# Patient Record
Sex: Female | Born: 1980 | Race: Black or African American | Hispanic: No | Marital: Single | Smoking: Current every day smoker
Health system: Southern US, Community
[De-identification: ages and names within clinical notes are randomized; demographics above are authoritative.]

## PROBLEM LIST (undated history)

## (undated) DIAGNOSIS — E785 Hyperlipidemia, unspecified: Secondary | ICD-10-CM

## (undated) HISTORY — DX: Hyperlipidemia, unspecified: E78.5

---

## 2018-06-11 ENCOUNTER — Encounter (HOSPITAL_COMMUNITY): Payer: Self-pay | Admitting: Emergency Medicine

## 2018-06-11 ENCOUNTER — Emergency Department (HOSPITAL_COMMUNITY)
Admission: EM | Admit: 2018-06-11 | Discharge: 2018-06-11 | Disposition: A | Discharge status: Home / Self Care | Payer: Self-pay | Attending: Emergency Medicine | Admitting: Emergency Medicine

## 2018-06-11 ENCOUNTER — Other Ambulatory Visit: Payer: Self-pay

## 2018-06-11 DIAGNOSIS — G8929 Other chronic pain: Secondary | ICD-10-CM | POA: Insufficient documentation

## 2018-06-11 DIAGNOSIS — M25562 Pain in left knee: Secondary | ICD-10-CM | POA: Insufficient documentation

## 2018-06-11 DIAGNOSIS — F17210 Nicotine dependence, cigarettes, uncomplicated: Secondary | ICD-10-CM | POA: Insufficient documentation

## 2018-06-11 NOTE — ED Triage Notes (Signed)
Pt c/o left knee pain x year. States does l;ot of walking and standing at work. Denies any new falls or injuries.

## 2018-06-11 NOTE — Discharge Instructions (Addendum)
You have been diagnosed today with left knee pain.  At this time there does not appear to be the presence of an emergent medical condition, however there is always the potential for conditions to change. Please read and follow the below instructions.  Please return to the Emergency Department immediately for any new or worsening symptoms or if your symptoms. Please be sure to follow up with your Primary Care Provider within one week regarding your visit today; please call their office to schedule an appointment even if you are feeling better for a follow-up visit. You have refused x-ray today.  You may have ligament or tendon injury of the left knee.  Fractures are also possible.  Please use the knee sleeve given to you today for support of your left knee.  Please use rest, ice and elevation to help with your symptoms.  Please avoid activities that exacerbate your knee pain. Please call the orthopedic specialist Dr. Nona Dell office on Monday to schedule a follow-up appointment regarding your left knee pain.  Get help right away if: Your knee swells, and the swelling gets worse. You cannot move your knee. You have very bad knee pain. You have fever You have any new or concerning symptoms.  Please read the additional information packets attached to your discharge summary.  Do not take your medicine if  develop an itchy rash, swelling in your mouth or lips, or difficulty breathing.

## 2018-06-11 NOTE — ED Provider Notes (Signed)
[facility] DEPT Provider Note   CSN: [id number] Arrival date & time: 06/11/18  1506    History   Chief Complaint Chief Complaint  Patient presents with  . Knee Pain    HPI Tammy Vargas is a 38 y.o. female presented today for left knee pain.  Patient reports that she has had pain to the left knee for greater than 1 year.  She states that she was previously evaluated and told that she had ligament injury of an unknown ligament in her knee.  She never followed up with orthopedic specialist regarding her pain.  Patient reports that she has had intermittent pain of the left knee for the past year.  Patient states that over the past 2 weeks she has been on her feet a lot more often at work and feels that this is exacerbated her pain.  Patient describes a moderate intensity aching pain to the medial aspect of her right knee that is only present after working on her feet for long periods of time and is improved with rest and elevation.  Patient denies injury or trauma to her knee.  She denies fall.  She denies fever or swelling.  Patient reports that her pain is not severe today because she was off of work and was able to elevate it for most of the day.  Patient reports that she does not want x-ray today and she only wants referral to orthopedist and work note.    HPI  History reviewed. No pertinent past medical history.  There are no active problems to display for this patient.   History reviewed. No pertinent surgical history.   OB History   No obstetric history on file.      Home Medications    Prior to Admission medications   Not on File    Family History No family history on file.  Social History Social History   Tobacco Use  . Smoking status: Current Every Day Smoker    Types: Cigarettes  . Smokeless tobacco: Never Used  Substance Use Topics  . Alcohol use: Not on file  . Drug use: Not on file     Allergies   Patient has no  known allergies.   Review of Systems Review of Systems  Constitutional: Negative.  Negative for chills and fever.  Musculoskeletal: Positive for arthralgias (Left knee). Negative for back pain, joint swelling and neck pain.  Skin: Negative.  Negative for color change and wound.  Neurological: Negative.  Negative for dizziness, weakness, numbness and headaches.   Physical Exam Updated Vital Signs BP (!) 135/93 (BP Location: Right Arm)   Pulse 84   Temp 98.5 F (36.9 C) (Oral)   Resp 18   Ht 5\' 2"  (1.575 m)   Wt 68 kg   LMP 05/17/2018   SpO2 99%   BMI 27.44 kg/m   Physical Exam Constitutional:      General: She is not in acute distress.    Appearance: Normal appearance. She is well-developed. She is not ill-appearing or diaphoretic.  HENT:     Head: Normocephalic and atraumatic.     Right Ear: External ear normal.     Left Ear: External ear normal.     Nose: Nose normal.  Eyes:     General: Vision grossly intact. Gaze aligned appropriately.     Pupils: Pupils are equal, round, and reactive to light.  Neck:     Musculoskeletal: Normal range of motion.     Trachea:  Trachea and phonation normal. No tracheal deviation.  Cardiovascular:     Pulses:          Dorsalis pedis pulses are 2+ on the right side and 2+ on the left side.       Posterior tibial pulses are 2+ on the right side and 2+ on the left side.  Pulmonary:     Effort: Pulmonary effort is normal. No respiratory distress.  Musculoskeletal: Normal range of motion.     Right knee: Normal.     Left knee: She exhibits normal range of motion, no swelling, no effusion, no ecchymosis and no deformity. Tenderness found. Medial joint line tenderness noted. No lateral joint line and no patellar tendon tenderness noted.     Right ankle: Normal.     Left ankle: Normal.     Right lower leg: Normal.     Left lower leg: Normal.     Right foot: Normal.     Left foot: Normal.     Comments: Left Knee:   Appearance normal. No  obvious deformity. No skin swelling, erythema, heat, fluctuance or break of the skin.  Tenderness to palpation over medial joint line.  Active and passive flexion and extension intact without crepitus but with mild pain.  Negative anterior/poster drawer bilaterally. Negative McMurray's test. Negative ballottement test. No varus or valgus laxity or locking. No tenderness to palpation of hips or ankles.Compartments soft.  Neurovascular intact to bilateral lower extremities.  Patient is ambulatory around room with slight limping gait secondary to left knee pain.   Feet:     Right foot:     Protective Sensation: 5 sites tested. 5 sites sensed.     Left foot:     Protective Sensation: 5 sites tested. 5 sites sensed.  Skin:    General: Skin is warm and dry.  Neurological:     Mental Status: She is alert.     GCS: GCS eye subscore is 4. GCS verbal subscore is 5. GCS motor subscore is 6.     Comments: Speech is clear and goal oriented, follows commands Major Cranial nerves without deficit, no facial droop Moves extremities without ataxia, coordination intact  Psychiatric:        Behavior: Behavior normal.    ED Treatments / Results  Labs (all labs ordered are listed, but only abnormal results are displayed) Labs Reviewed - No data to display  EKG None  Radiology No results found.  Procedures Procedures (including critical care time)  Medications Ordered in ED Medications - No data to display   Initial Impression / Assessment and Plan / ED Course  I have reviewed the triage vital signs and the nursing notes.  Pertinent labs & imaging results that were available during my care of the patient were reviewed by me and considered in my medical decision making (see chart for details).    38 year old female reports is otherwise healthy presenting today for acute on chronic left knee pain.  Patient denies injury or trauma to the knee and states that has been causing her intermittent  pain for the past 1 year.  Patient attributes her exacerbation to increasing ambulation at work over the past few days.  Patient has been using rice protocol with relief of her symptoms.  Patient is requesting work note today as well as orthopedic referral.  Physical examination reveals no acute abnormalities of the bilateral lower extremities.  She has some tenderness of the medial joint line of the left knee without deformity,  sign of injury or effusion.  No obvious laxity on examination today and patient is ambulatory without assistance. Bilateral lower extremities neurovascularly intact; no signs of infection, septic joint, DVT, compartment syndrome. Patient with full ROM and 5/5 strength with all movements.  I have offered the patient x-ray today of her left knee to evaluate for injury as she has not had previous imaging in our facility however she has refused.  Patient is requesting discharge with referral to an orthopedist and a work note.  I feel that this is reasonable at this time and the symptom provided to the patient.  Patient has been provided with a knee sleeve in emergency department today.  At this time there does not appear to be any evidence of an acute emergency medical condition and the patient appears stable for discharge with appropriate outpatient follow up. Diagnosis was discussed with patient who verbalizes understanding of care plan and is agreeable to discharge. I have discussed return precautions with patient and family at bedside who verbalize understanding of return precautions. Patient strongly encouraged to follow-up with their PCP and ortho. All questions answered.   Note: Portions of this report may have been transcribed using voice recognition software. Every effort was made to ensure accuracy; however, inadvertent computerized transcription errors may still be present. Final Clinical Impressions(s) / ED Diagnoses   Final diagnoses:  Chronic pain of left knee    ED  Discharge Orders    None       Gari Crown 06/11/18 1655    Lacretia Leigh, MD 06/12/18 2306

## 2018-06-23 ENCOUNTER — Encounter (HOSPITAL_COMMUNITY): Payer: Self-pay | Admitting: Emergency Medicine

## 2018-06-23 ENCOUNTER — Other Ambulatory Visit: Payer: Self-pay

## 2018-06-23 ENCOUNTER — Emergency Department (HOSPITAL_COMMUNITY)
Admission: EM | Admit: 2018-06-23 | Discharge: 2018-06-23 | Disposition: A | Discharge status: Home / Self Care | Payer: Medicaid Other | Attending: Emergency Medicine | Admitting: Emergency Medicine

## 2018-06-23 DIAGNOSIS — L739 Follicular disorder, unspecified: Secondary | ICD-10-CM | POA: Insufficient documentation

## 2018-06-23 DIAGNOSIS — F17210 Nicotine dependence, cigarettes, uncomplicated: Secondary | ICD-10-CM | POA: Insufficient documentation

## 2018-06-23 MED ORDER — DOXYCYCLINE HYCLATE 100 MG PO CAPS: 100.0000 mg | ORAL_CAPSULE | Freq: Two times a day (BID) | ORAL | 0 refills | Status: DC

## 2018-06-23 NOTE — Discharge Instructions (Addendum)
Doxycycline as prescribed.  Follow-up with dermatology if your symptoms or not improving in the next week, and return to the ER if symptoms significantly worsen or change.

## 2018-06-23 NOTE — ED Provider Notes (Signed)
[facility] EMERGENCY DEPARTMENT Provider Note   CSN: [id number] Arrival date & time: 06/23/18  1122    History   Chief Complaint Chief Complaint  Patient presents with  . Rash    HPI Tammy Vargas is a 38 y.o. female.     Patient is a 39 year old female with no significant past medical history.  She presents today with complaints of facial rash.  She reports using proactive for several months, but has never had an issue with it before.  The rash is somewhat itchy.  She denies any other new contacts or exposures.  The rash is located nowhere else on her body.  The history is provided by the patient.  Rash  Location:  Face Quality: itchiness   Severity:  Mild Timing:  Constant Progression:  Worsening Chronicity:  New Relieved by:  Nothing Worsened by:  Nothing Ineffective treatments:  None tried Associated symptoms: no diarrhea and no fatigue     History reviewed. No pertinent past medical history.  There are no active problems to display for this patient.   History reviewed. No pertinent surgical history.   OB History   No obstetric history on file.      Home Medications    Prior to Admission medications   Not on File    Family History No family history on file.  Social History Social History   Tobacco Use  . Smoking status: Current Every Day Smoker    Types: Cigarettes  . Smokeless tobacco: Never Used  Substance Use Topics  . Alcohol use: Not on file  . Drug use: Not on file     Allergies   Patient has no known allergies.   Review of Systems Review of Systems  Constitutional: Negative for fatigue.  Gastrointestinal: Negative for diarrhea.  Skin: Positive for rash.  All other systems reviewed and are negative.    Physical Exam Updated Vital Signs BP 140/84 (BP Location: Right Arm)   Pulse 81   Temp 99.2 F (37.3 C) (Oral)   Resp 14   SpO2 99%   Physical Exam Vitals signs and nursing note reviewed.   Constitutional:      Appearance: Normal appearance.  HENT:     Head: Normocephalic.  Pulmonary:     Effort: Pulmonary effort is normal.  Skin:    General: Skin is warm and dry.     Comments: Patient has a follicular rash to the face.  Neurological:     General: No focal deficit present.     Mental Status: She is alert.      ED Treatments / Results  Labs (all labs ordered are listed, but only abnormal results are displayed) Labs Reviewed - No data to display  EKG None  Radiology No results found.  Procedures Procedures (including critical care time)  Medications Ordered in ED Medications - No data to display   Initial Impression / Assessment and Plan / ED Course  I have reviewed the triage vital signs and the nursing notes.  Pertinent labs & imaging results that were available during my care of the patient were reviewed by me and considered in my medical decision making (see chart for details).  The rash is acting like in appearance.  Patient will be treated with doxycycline and follow-up with dermatology if not improving in the next week.  I will also have her stop using her proactive.  Final Clinical Impressions(s) / ED Diagnoses   Final diagnoses:  None  ED Discharge Orders    None       Veryl Speak, MD 06/23/18 1151

## 2018-06-23 NOTE — ED Triage Notes (Addendum)
Rash on face x 1 week , states itchy and it burns,  Started  Pro active  Face cream a month ago  Just on faCE and upper neck no where else denies any thing new detergent,  States has been using vaseline with the proactive, denies sob or difficulty breathing or swallowing

## 2019-02-13 ENCOUNTER — Other Ambulatory Visit: Payer: Self-pay

## 2019-02-13 DIAGNOSIS — Z20822 Contact with and (suspected) exposure to covid-19: Secondary | ICD-10-CM

## 2019-02-14 LAB — NOVEL CORONAVIRUS, NAA: SARS-CoV-2, NAA: NOT DETECTED

## 2019-10-03 ENCOUNTER — Other Ambulatory Visit: Payer: Self-pay

## 2019-10-03 ENCOUNTER — Encounter (HOSPITAL_COMMUNITY): Payer: Self-pay

## 2019-10-03 ENCOUNTER — Ambulatory Visit (HOSPITAL_COMMUNITY)
Admission: EM | Admit: 2019-10-03 | Discharge: 2019-10-03 | Disposition: A | Discharge status: Home / Self Care | Payer: Medicaid Other | Attending: Family Medicine | Admitting: Family Medicine

## 2019-10-03 DIAGNOSIS — S90852A Superficial foreign body, left foot, initial encounter: Secondary | ICD-10-CM

## 2019-10-03 NOTE — ED Triage Notes (Signed)
Reports she got a splinter in the heel of her left foot one week ago. States she thought she got everything out, but yesterday the area became swollen and painful.

## 2019-10-03 NOTE — Discharge Instructions (Addendum)
We opened up the bottom your foot got a lot of infection out.  Hopefully piece of the splinter came out with this. We also flushed the foot slightly. Keep soaking the foot, massage the area and if symptoms continue or worsen please follow-up

## 2019-10-03 NOTE — ED Provider Notes (Signed)
[facility]    CSN: [id number] Arrival date & time: 10/03/19  0865      History   Chief Complaint Chief Complaint  Patient presents with  . Foreign Body in Skin    HPI Tammy Vargas is a 39 y.o. female.   Patient is a 39 year old female presents today with foreign body in left heel.  Reporting she got a splinter in the left heel and attempted to remove it on her own.  Reporting she thought that she got most of the splinter out but since she has had increased pain and swelling of the left heel.  She is attempted to soak the foot and remove without any relief.     History reviewed. No pertinent past medical history.  There are no problems to display for this patient.   History reviewed. No pertinent surgical history.  OB History   No obstetric history on file.      Home Medications    Prior to Admission medications   Medication Sig Start Date End Date Taking? Authorizing Provider  doxycycline (VIBRAMYCIN) 100 MG capsule Take 1 capsule (100 mg total) by mouth 2 (two) times daily. One po bid x 7 days 06/23/18   Veryl Speak, MD    Family History History reviewed. No pertinent family history.  Social History Social History   Tobacco Use  . Smoking status: Current Every Day Smoker    Types: Cigarettes  . Smokeless tobacco: Never Used  Vaping Use  . Vaping Use: Never used  Substance Use Topics  . Alcohol use: Yes    Comment: occ  . Drug use: Not on file     Allergies   Patient has no known allergies.   Review of Systems Review of Systems   Physical Exam Triage Vital Signs ED Triage Vitals  Enc Vitals Group     BP 10/03/19 0858 (!) 161/104     Pulse Rate 10/03/19 0858 72     Resp 10/03/19 0858 18     Temp 10/03/19 0858 98.4 F (36.9 C)     Temp src --      SpO2 10/03/19 0858 97 %     Weight --      Height --      Head Circumference --      Peak Flow --      Pain Score 10/03/19 0857 10     Pain Loc --      Pain Edu? --       Excl. in [place]? --    No data found.  Updated Vital Signs BP (!) 161/104 Comment: second attempt  Pulse 72   Temp 98.4 F (36.9 C)   Resp 18   LMP 09/19/2019 (Within Days)   SpO2 97%   Visual Acuity Right Eye Distance:   Left Eye Distance:   Bilateral Distance:    Right Eye Near:   Left Eye Near:    Bilateral Near:     Physical Exam Vitals and nursing note reviewed.  Constitutional:      General: She is not in acute distress.    Appearance: Normal appearance. She is not ill-appearing, toxic-appearing or diaphoretic.  HENT:     Head: Normocephalic.     Nose: Nose normal.  Eyes:     Conjunctiva/sclera: Conjunctivae normal.  Pulmonary:     Effort: Pulmonary effort is normal.  Musculoskeletal:        General: Normal range of motion.     Cervical back: Normal  range of motion.       Feet:  Feet:     Comments: Swelling, tenderness and obvious underlying green/yellow discharge Very tender to touch. Skin:    General: Skin is warm and dry.     Findings: No rash.  Neurological:     Mental Status: She is alert.  Psychiatric:        Mood and Affect: Mood normal.      UC Treatments / Results  Labs (all labs ordered are listed, but only abnormal results are displayed) Labs Reviewed - No data to display  EKG   Radiology No results found.  Procedures Foreign Body Removal  Date/Time: 10/03/2019 10:30 AM Performed by: Orvan July, NP Authorized by: Orvan July, NP   Consent:    Consent obtained:  Verbal   Consent given by:  Patient   Risks discussed:  Bleeding, infection, pain, worsening of condition and incomplete removal   Alternatives discussed:  No treatment and alternative treatment Location:    Location:  Foot   Foot location:  L heel   Tendon involvement:  None Pre-procedure details:    Imaging:  None Anesthesia (see MAR for exact dosages):    Anesthesia method:  Local infiltration   Local anesthetic:  Lidocaine 2% w/o epi Procedure type:     Procedure complexity:  Simple Procedure details:    Scalpel size:  11   Localization method:  Probed   Dissection of underlying tissues: no     Bloodless field: no     Removal mechanism:  Forceps   Foreign bodies recovered:  None Post-procedure details:    Confirmation:  No additional foreign bodies on visualization   Skin closure:  None   Dressing:  Antibiotic ointment and adhesive bandage   Patient tolerance of procedure:  Tolerated with difficulty   (including critical care time)  Medications Ordered in UC Medications - No data to display  Initial Impression / Assessment and Plan / UC Course  I have reviewed the triage vital signs and the nursing notes.  Pertinent labs & imaging results that were available during my care of the patient were reviewed by me and considered in my medical decision making (see chart for details).     Foreign body in left heel Attempted removal here today. Injected with 2% lidocaine into heel With small opening in the heel and expressed moderate amount of purulent drainage from area No obvious foreign body Flushed area slightly Apply bacitracin and wrapped foot. Recommended continue the warm soaks to the foot and massage area to express more drainage and foreign body Follow up as needed for continued or worsening symptoms  Final Clinical Impressions(s) / UC Diagnoses   Final diagnoses:  Foreign body in left foot, initial encounter     Discharge Instructions     We opened up the bottom your foot got a lot of infection out.  Hopefully piece of the splinter came out with this. We also flushed the foot slightly. Keep soaking the foot, massage the area and if symptoms continue or worsen please follow-up    ED Prescriptions    None     PDMP not reviewed this encounter.   Loura Halt A, NP 10/03/19 1031

## 2019-10-08 ENCOUNTER — Ambulatory Visit (INDEPENDENT_AMBULATORY_CARE_PROVIDER_SITE_OTHER): Payer: Self-pay

## 2019-10-08 ENCOUNTER — Ambulatory Visit (HOSPITAL_COMMUNITY): Payer: Self-pay

## 2019-10-08 ENCOUNTER — Other Ambulatory Visit: Payer: Self-pay

## 2019-10-08 ENCOUNTER — Ambulatory Visit (HOSPITAL_COMMUNITY)
Admission: EM | Admit: 2019-10-08 | Discharge: 2019-10-08 | Disposition: A | Discharge status: Home / Self Care | Payer: Self-pay | Attending: Family Medicine | Admitting: Family Medicine

## 2019-10-08 ENCOUNTER — Encounter (HOSPITAL_COMMUNITY): Payer: Self-pay | Admitting: Emergency Medicine

## 2019-10-08 DIAGNOSIS — S90852A Superficial foreign body, left foot, initial encounter: Secondary | ICD-10-CM

## 2019-10-08 DIAGNOSIS — Z23 Encounter for immunization: Secondary | ICD-10-CM

## 2019-10-08 DIAGNOSIS — L03116 Cellulitis of left lower limb: Secondary | ICD-10-CM

## 2019-10-08 DIAGNOSIS — R030 Elevated blood-pressure reading, without diagnosis of hypertension: Secondary | ICD-10-CM

## 2019-10-08 MED ORDER — TETANUS-DIPHTH-ACELL PERTUSSIS 5-2.5-18.5 LF-MCG/0.5 IM SUSP
0.5000 mL | Freq: Once | INTRAMUSCULAR | Status: AC
  Administered 2019-10-08: 0.5 mL via INTRAMUSCULAR

## 2019-10-08 MED ORDER — HYDROCODONE-ACETAMINOPHEN 5-325 MG PO TABS
ORAL_TABLET | ORAL | Status: AC
  Filled 2019-10-08: qty 2

## 2019-10-08 MED ORDER — HYDROCODONE-ACETAMINOPHEN 5-325 MG PO TABS
2.0000 | ORAL_TABLET | Freq: Once | ORAL | Status: AC
  Administered 2019-10-08: 2 via ORAL

## 2019-10-08 MED ORDER — HYDROCODONE-ACETAMINOPHEN 7.5-325 MG PO TABS: 1.0000 | ORAL_TABLET | Freq: Four times a day (QID) | ORAL | 0 refills | Status: DC | PRN

## 2019-10-08 MED ORDER — TETANUS-DIPHTH-ACELL PERTUSSIS 5-2.5-18.5 LF-MCG/0.5 IM SUSP
INTRAMUSCULAR | Status: AC
  Filled 2019-10-08: qty 0.5

## 2019-10-08 MED ORDER — SULFAMETHOXAZOLE-TRIMETHOPRIM 800-160 MG PO TABS: 1.0000 | ORAL_TABLET | Freq: Two times a day (BID) | ORAL | 0 refills | Status: AC

## 2019-10-08 NOTE — ED Provider Notes (Addendum)
[facility]    CSN: [id number] Arrival date & time: 10/08/19  2585      History   Chief Complaint Chief Complaint  Patient presents with  . Foreign Body in Skin    HPI Tammy Vargas is a 39 y.o. female.   HPI   Injury occurred over 2 weeks ago Was walking in sandals and got FB in heel- left Thinks it may have been a splinter but is uncertain Came here on 6/23 and had area explored.  There was a pus pocket opened, but no obvious FB came out She is here today with continued pain, swelling and drainage from heel.  It is getting worse. No fever or chills Both of her parents have hypertension.  It is noted that both of her last visits have had an elevated blood pressure.  I discussed this with the patient.  She thinks is because of her pain.  She agrees to get the blood pressure checked when she is feeling better.  The need for primary care is emphasized. Tetanus is not up-to-date.    History reviewed. No pertinent past medical history.  There are no problems to display for this patient.   History reviewed. No pertinent surgical history.  OB History   No obstetric history on file.      Home Medications    Prior to Admission medications   Medication Sig Start Date End Date Taking? Authorizing Provider  HYDROcodone-acetaminophen (NORCO) 7.5-325 MG tablet Take 1 tablet by mouth every 6 (six) hours as needed for moderate pain. 10/08/19   Raylene Everts, MD  sulfamethoxazole-trimethoprim (BACTRIM DS) 800-160 MG tablet Take 1 tablet by mouth 2 (two) times daily for 7 days. 10/08/19 10/15/19  Raylene Everts, MD    Family History Family History  Problem Relation Age of Onset  . Hypertension Mother   . Hypertension Father     Social History Social History   Tobacco Use  . Smoking status: Current Every Day Smoker    Types: Cigarettes  . Smokeless tobacco: Never Used  Vaping Use  . Vaping Use: Never used  Substance Use Topics  . Alcohol use: Yes     Comment: occ  . Drug use: Not Currently     Allergies   Patient has no known allergies.   Review of Systems Review of Systems  Skin: Positive for wound.     Physical Exam Triage Vital Signs ED Triage Vitals  Enc Vitals Group     BP 10/08/19 1050 (!) 150/90     Pulse Rate 10/08/19 1050 (!) 101     Resp 10/08/19 1050 18     Temp 10/08/19 1050 98.1 F (36.7 C)     Temp Source 10/08/19 1050 Oral     SpO2 10/08/19 1050 100 %     Weight --      Height --      Head Circumference --      Peak Flow --      Pain Score 10/08/19 1110 10     Pain Loc --      Pain Edu? --      Excl. in [place]? --    No data found.  Updated Vital Signs BP (!) 150/110 (BP Location: Right Arm)   Pulse 68   Temp 98.1 F (36.7 C) (Oral)   Resp 18   LMP 09/19/2019 (Exact Date)   SpO2 100%     Physical Exam Constitutional:      General: She  is not in acute distress.    Appearance: She is well-developed.     Comments: Appears uncomfortable  HENT:     Head: Normocephalic and atraumatic.  Eyes:     Conjunctiva/sclera: Conjunctivae normal.     Pupils: Pupils are equal, round, and reactive to light.  Cardiovascular:     Rate and Rhythm: Normal rate.  Pulmonary:     Effort: Pulmonary effort is normal. No respiratory distress.  Abdominal:     General: There is no distension.     Palpations: Abdomen is soft.  Musculoskeletal:        General: Normal range of motion.     Cervical back: Normal range of motion.       Feet:  Feet:     Comments: The heel around the wound is debrided.  Top layer of skin is cut away.  There is much purulence expressed from the heel.  Patient did not tolerate procedure well.  I had to give her hydrocodone and repeat working on procedure later.  Never did find a foreign body.  Was never able to explore wound. Skin:    General: Skin is warm and dry.  Neurological:     Mental Status: She is alert.  Psychiatric:     Comments: Patient in too much pain to allow very much  examination      UC Treatments / Results  Labs (all labs ordered are listed, but only abnormal results are displayed) Labs Reviewed - No data to display  EKG   Radiology DG Os Calcis Left  Result Date: 10/08/2019 CLINICAL DATA:  Patient status post attempted removal of a splinter from the left heel 10/03/2019. The patient feels like the splinter is still there. EXAM: LEFT OS CALCIS - 2+ VIEW COMPARISON:  None. FINDINGS: Clydene Laming is not visible on radiographs. No radiopaque foreign body is seen. No soft tissue gas. Skin defect on the heel compatible with a prior attempted splinter removal noted. No bony abnormality. IMPRESSION: No radiopaque foreign body is identified. Please note that wood is not visible on x-ray. Skin defect on the heel consistent with prior attempted splinter removal. Electronically Signed   By: Inge Rise M.D.   On: 10/08/2019 12:43    Procedures Foreign Body Removal  Date/Time: 10/08/2019 2:20 PM Performed by: Raylene Everts, MD Authorized by: Raylene Everts, MD   Consent:    Consent obtained:  Verbal   Consent given by:  Patient   Risks discussed:  Incomplete removal   Alternatives discussed:  No treatment Location:    Location:  Foot   Foot location:  L heel   Depth:  Subcutaneous   Tendon involvement:  None Pre-procedure details:    Imaging:  X-ray   Neurovascular status: intact   Anesthesia (see MAR for exact dosages):    Anesthesia method:  None Procedure type:    Procedure complexity:  Simple Procedure details:    Scalpel size:  11   Dissection of underlying tissues: no     Bloodless field: yes     Removal mechanism:  Irrigation   Foreign bodies recovered:  None   Intact foreign body removal: no   Post-procedure details:    Neurovascular status: intact     Skin closure:  None   Patient tolerance of procedure:  Procedure terminated at patient's request Comments:     Patient did not tolerate probing of wound or extensive  irrigation.  much purulence expressed.  No FB seen   (including  critical care time)  Medications Ordered in UC Medications  Tdap (BOOSTRIX) injection 0.5 mL (0.5 mLs Intramuscular Given 10/08/19 1151)  HYDROcodone-acetaminophen (NORCO/VICODIN) 5-325 MG per tablet 2 tablet (2 tablets Oral Given 10/08/19 1151)    Initial Impression / Assessment and Plan / UC Course  I have reviewed the triage vital signs and the nursing notes.  Pertinent labs & imaging results that were available during my care of the patient were reviewed by me and considered in my medical decision making (see chart for details).     Final Clinical Impressions(s) / UC Diagnoses   Final diagnoses:  Cellulitis of left heel     Discharge Instructions     Please be sure to re check your blood pressure  Take the antibiotic as directed Take the pain medicine as needed Avoid pressure on heel for a few days  Return if not improving in 3-4 days   ED Prescriptions    Medication Sig Dispense Auth. Provider   sulfamethoxazole-trimethoprim (BACTRIM DS) 800-160 MG tablet Take 1 tablet by mouth 2 (two) times daily for 7 days. 14 tablet Raylene Everts, MD   HYDROcodone-acetaminophen [facility]) 7.5-325 MG tablet Take 1 tablet by mouth every 6 (six) hours as needed for moderate pain. 15 tablet Raylene Everts, MD     I have reviewed the PDMP during this encounter.   Raylene Everts, MD 10/08/19 1419    Raylene Everts, MD 10/08/19 [phone]

## 2019-10-08 NOTE — Discharge Instructions (Signed)
Please be sure to re check your blood pressure  Take the antibiotic as directed Take the pain medicine as needed Avoid pressure on heel for a few days  Return if not improving in 3-4 days

## 2019-10-08 NOTE — ED Triage Notes (Addendum)
Patient was seen 10/03/2019 and had splinter removed.  Reports injured foot a week prior to coming to [facility].  Pus oozing from wound.  Foot swollen

## 2021-04-25 ENCOUNTER — Encounter (HOSPITAL_COMMUNITY): Payer: Self-pay | Admitting: Emergency Medicine

## 2021-04-25 ENCOUNTER — Other Ambulatory Visit: Payer: Self-pay

## 2021-04-25 ENCOUNTER — Emergency Department (HOSPITAL_COMMUNITY)
Admission: EM | Admit: 2021-04-25 | Discharge: 2021-04-25 | Disposition: A | Discharge status: Home / Self Care | Payer: Medicaid Other | Attending: Student | Admitting: Student

## 2021-04-25 DIAGNOSIS — S61211A Laceration without foreign body of left index finger without damage to nail, initial encounter: Secondary | ICD-10-CM

## 2021-04-25 DIAGNOSIS — S61213A Laceration without foreign body of left middle finger without damage to nail, initial encounter: Secondary | ICD-10-CM | POA: Insufficient documentation

## 2021-04-25 DIAGNOSIS — F17210 Nicotine dependence, cigarettes, uncomplicated: Secondary | ICD-10-CM | POA: Insufficient documentation

## 2021-04-25 DIAGNOSIS — W260XXA Contact with knife, initial encounter: Secondary | ICD-10-CM | POA: Insufficient documentation

## 2021-04-25 NOTE — ED Provider Notes (Signed)
[facility] EMERGENCY DEPARTMENT Provider Note  CSN: [id number] Arrival date & time: 04/25/21 3614  Chief Complaint(s) finger laceration  HPI Tammy Vargas is a 41 y.o. female who presents emergency department for evaluation of a finger laceration.  Patient states that approximately 11 PM last night she cut her finger on a kitchen knife.  She washed the wound out copiously with water but the wound continued to bleed this morning and thus came to the emergency department for evaluation.  Denies numbness, tingling, weakness of the hand.  Pulses intact.  Denies additional traumatic injury.  HPI  Past Medical History History reviewed. No pertinent past medical history. There are no problems to display for this patient.  Home Medication(s) Prior to Admission medications   Medication Sig Start Date End Date Taking? Authorizing Provider  HYDROcodone-acetaminophen (NORCO) 7.5-325 MG tablet Take 1 tablet by mouth every 6 (six) hours as needed for moderate pain. 10/08/19   Raylene Everts, MD                                                                                                                                    Past Surgical History History reviewed. No pertinent surgical history. Family History Family History  Problem Relation Age of Onset   Hypertension Mother    Hypertension Father     Social History Social History   Tobacco Use   Smoking status: Every Day    Types: Cigarettes   Smokeless tobacco: Never  Vaping Use   Vaping Use: Never used  Substance Use Topics   Alcohol use: Yes    Comment: occ   Drug use: Yes    Types: Marijuana   Allergies Patient has no known allergies.  Review of Systems Review of Systems  Skin:  Positive for wound.   Physical Exam Vital Signs  I have reviewed the triage vital signs BP (!) 176/113    Pulse 64    Temp 98.6 F (37 C) (Oral)    Resp 17    LMP 04/18/2021    SpO2 100%   Physical Exam Vitals and  nursing note reviewed.  Constitutional:      General: She is not in acute distress.    Appearance: She is well-developed.  HENT:     Head: Normocephalic and atraumatic.  Eyes:     Conjunctiva/sclera: Conjunctivae normal.  Cardiovascular:     Rate and Rhythm: Normal rate and regular rhythm.     Heart sounds: No murmur heard. Pulmonary:     Effort: Pulmonary effort is normal. No respiratory distress.     Breath sounds: Normal breath sounds.  Abdominal:     Palpations: Abdomen is soft.     Tenderness: There is no abdominal tenderness.  Musculoskeletal:        General: No swelling.     Cervical back: Neck supple.  Skin:    General: Skin is warm and dry.  Capillary Refill: Capillary refill takes less than 2 seconds.     Findings: Lesion (3 cm laceration to the middle finger pad on the second digit of the left hand) present.  Neurological:     Mental Status: She is alert.  Psychiatric:        Mood and Affect: Mood normal.    ED Results and Treatments Labs (all labs ordered are listed, but only abnormal results are displayed) Labs Reviewed - No data to display                                                                                                                        Radiology No results found.  Pertinent labs & imaging results that were available during my care of the patient were reviewed by me and considered in my medical decision making (see MDM for details).  Medications Ordered in ED Medications - No data to display                                                                                                                                   Procedures .Marland KitchenLaceration Repair  Date/Time: 04/25/2021 10:55 AM Performed by: Teressa Lower, MD Authorized by: Teressa Lower, MD   Anesthesia:    Anesthesia method:  Nerve block   Block location:  Digital block   Block anesthetic:  Lidocaine 1% w/o epi   Block injection procedure:  Anatomic landmarks  identified, introduced needle and anatomic landmarks palpated   Block outcome:  Anesthesia achieved Laceration details:    Location:  Finger   Finger location:  L index finger   Length (cm):  3 Pre-procedure details:    Preparation:  Patient was prepped and draped in usual sterile fashion Exploration:    Contaminated: no   Skin repair:    Repair method:  Sutures   Suture size:  5-0   Suture material:  Prolene   Suture technique:  Simple interrupted and horizontal mattress   Number of sutures:  3 Approximation:    Approximation:  Close Repair type:    Repair type:  Simple Post-procedure details:    Dressing:  Non-adherent dressing   Procedure completion:  Tolerated well, no immediate complications  (including critical care time)  Medical Decision Making / ED Course   This patient presents to the ED for concern of finger laceration, this involves an extensive number of treatment options, and is a  complaint that carries with it a high risk of complications and morbidity.  The differential diagnosis includes laceration, fracture, retained foreign body  MDM: Patient seen emergency department for evaluation of a finger laceration.  Physical exam reveals a 3 cm laceration to the middle finger pad on the palmar surface of the hand, second digit on the left.  Local wound exploration shows no retained foreign bodies.  Digital block performed and wound repaired with 5-0 Prolene sutures.  I initially attempted to use simple interrupted sutures only, but the sutures tore through the skin when attempting to close the wound and thus she required 2 horizontal mattress sutures and 1 simple interrupted suture to close the wound.  Tetanus is already up-to-date last year.  Wound dressed and patient discharged with instructions to have sutures removed in 1 week   Additional history obtained:  -External records from outside source obtained and reviewed including: Chart review including previous notes,  labs, imaging, consultation notes   Lab Tests: -I ordered, reviewed, and interpreted labs.   The pertinent results include:   Labs Reviewed - No data to display    EKG  EKG Interpretation  Date/Time:    Ventricular Rate:    PR Interval:    QRS Duration:   QT Interval:    QTC Calculation:   R Axis:     Text Interpretation:            Medicines ordered and prescription drug management: No orders of the defined types were placed in this encounter.   -I have reviewed the patients home medicines and have made adjustments as needed  Critical interventions none   Cardiac Monitoring: The patient was maintained on a cardiac monitor.  I personally viewed and interpreted the cardiac monitored which showed an underlying rhythm of: Normal sinus rhythm  Social Determinants of Health:  Factors impacting patients care include: Lack of primary care physician   Reevaluation: After the interventions noted above, I reevaluated the patient and found that they have :improved  Co morbidities that complicate the patient evaluation History reviewed. No pertinent past medical history.    Dispostion: Discharge     Final Clinical Impression(s) / ED Diagnoses Final diagnoses:  None     @PCDICTATION @    Teressa Lower, MD 04/25/21 1058

## 2021-04-25 NOTE — ED Notes (Signed)
Nonadherent pad and coban applied to pt's finger. Pt educated on wound care

## 2021-04-25 NOTE — ED Triage Notes (Signed)
Approx 1 inch laceration to L index finger from a knife while cutting a hamburger around 11pm last night. Bleeding controlled.

## 2021-05-07 ENCOUNTER — Emergency Department (HOSPITAL_COMMUNITY)
Admission: EM | Admit: 2021-05-07 | Discharge: 2021-05-07 | Disposition: A | Discharge status: Home / Self Care | Payer: No Typology Code available for payment source | Attending: Emergency Medicine | Admitting: Emergency Medicine

## 2021-05-07 ENCOUNTER — Emergency Department (HOSPITAL_COMMUNITY): Payer: No Typology Code available for payment source

## 2021-05-07 ENCOUNTER — Other Ambulatory Visit: Payer: Self-pay

## 2021-05-07 ENCOUNTER — Encounter (HOSPITAL_COMMUNITY): Payer: Self-pay

## 2021-05-07 DIAGNOSIS — S6992XD Unspecified injury of left wrist, hand and finger(s), subsequent encounter: Secondary | ICD-10-CM | POA: Diagnosis present

## 2021-05-07 DIAGNOSIS — M549 Dorsalgia, unspecified: Secondary | ICD-10-CM | POA: Diagnosis not present

## 2021-05-07 DIAGNOSIS — S61211D Laceration without foreign body of left index finger without damage to nail, subsequent encounter: Secondary | ICD-10-CM | POA: Diagnosis not present

## 2021-05-07 DIAGNOSIS — R079 Chest pain, unspecified: Secondary | ICD-10-CM | POA: Diagnosis not present

## 2021-05-07 DIAGNOSIS — Y92410 Unspecified street and highway as the place of occurrence of the external cause: Secondary | ICD-10-CM | POA: Insufficient documentation

## 2021-05-07 DIAGNOSIS — R519 Headache, unspecified: Secondary | ICD-10-CM | POA: Diagnosis not present

## 2021-05-07 DIAGNOSIS — M542 Cervicalgia: Secondary | ICD-10-CM | POA: Diagnosis not present

## 2021-05-07 DIAGNOSIS — T07XXXA Unspecified multiple injuries, initial encounter: Secondary | ICD-10-CM

## 2021-05-07 MED ORDER — HYDROCODONE-ACETAMINOPHEN 5-325 MG PO TABS
1.0000 | ORAL_TABLET | Freq: Once | ORAL | Status: AC
  Administered 2021-05-07: 1 via ORAL
  Filled 2021-05-07: qty 1

## 2021-05-07 NOTE — ED Notes (Signed)
I removed 3 stitches from pt's 2nd left digit of hand. The skin above the area of the stitches is swollen. The area where the stitches were is intact. I placed a dry dressing on it.
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
## 2021-05-07 NOTE — ED Notes (Signed)
Walked patient to the bathroom patient did well 
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
## 2021-05-11 ENCOUNTER — Other Ambulatory Visit: Payer: Self-pay

## 2021-05-11 ENCOUNTER — Telehealth (HOSPITAL_COMMUNITY): Payer: Self-pay | Admitting: *Deleted

## 2021-05-11 ENCOUNTER — Encounter (HOSPITAL_COMMUNITY): Payer: Self-pay | Admitting: *Deleted

## 2021-05-11 ENCOUNTER — Ambulatory Visit (HOSPITAL_COMMUNITY)
Admission: EM | Admit: 2021-05-11 | Discharge: 2021-05-11 | Disposition: A | Discharge status: Home / Self Care | Payer: Self-pay | Attending: Internal Medicine | Admitting: Internal Medicine

## 2021-05-11 DIAGNOSIS — M549 Dorsalgia, unspecified: Secondary | ICD-10-CM

## 2021-05-11 DIAGNOSIS — L089 Local infection of the skin and subcutaneous tissue, unspecified: Secondary | ICD-10-CM

## 2021-05-11 MED ORDER — CEPHALEXIN 500 MG PO CAPS: 500.0000 mg | ORAL_CAPSULE | Freq: Three times a day (TID) | ORAL | 0 refills | Status: DC

## 2021-05-11 MED ORDER — METHOCARBAMOL 500 MG PO TABS: 500.0000 mg | ORAL_TABLET | Freq: Every day | ORAL | 0 refills | Status: DC

## 2021-05-11 MED ORDER — MELOXICAM 7.5 MG PO TABS: 7.5000 mg | ORAL_TABLET | Freq: Every day | ORAL | 0 refills | Status: DC

## 2021-05-11 MED ORDER — CEPHALEXIN 500 MG PO CAPS: 500.0000 mg | ORAL_CAPSULE | Freq: Three times a day (TID) | ORAL | 0 refills | Status: AC
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
## 2021-05-11 NOTE — Discharge Instructions (Addendum)
Warm soaks Please go to the [facility] for incision and drainage Take medications as prescribed If you have worsening symptoms please return to urgent care to be reevaluated Gentle range of motion of the upper back and neck Heating pad use on a 20-minute on-20 minutes off cycle Precautions as above.

## 2021-05-12 ENCOUNTER — Encounter (HOSPITAL_COMMUNITY): Payer: Self-pay | Admitting: Emergency Medicine

## 2021-05-12 ENCOUNTER — Other Ambulatory Visit: Payer: Self-pay

## 2021-05-12 ENCOUNTER — Ambulatory Visit (HOSPITAL_COMMUNITY)
Admission: EM | Admit: 2021-05-12 | Discharge: 2021-05-12 | Disposition: A | Discharge status: Home / Self Care | Payer: Self-pay | Attending: Family Medicine | Admitting: Family Medicine

## 2021-05-12 DIAGNOSIS — M549 Dorsalgia, unspecified: Secondary | ICD-10-CM

## 2021-05-12 DIAGNOSIS — L089 Local infection of the skin and subcutaneous tissue, unspecified: Secondary | ICD-10-CM

## 2021-05-12 MED ORDER — IBUPROFEN 800 MG PO TABS: 800.0000 mg | ORAL_TABLET | Freq: Three times a day (TID) | ORAL | 0 refills | Status: DC | PRN

## 2021-05-12 NOTE — Discharge Instructions (Addendum)
Continue taking cephalexin 3 times daily.  Do warm soaks on your sore finger.  Stop meloxicam.  And begin taking ibuprofen 800 mg 1 every 8 hours as needed for pain
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
## 2021-06-05 ENCOUNTER — Encounter (HOSPITAL_COMMUNITY): Payer: Self-pay | Admitting: Emergency Medicine

## 2021-06-05 ENCOUNTER — Other Ambulatory Visit: Payer: Self-pay

## 2021-06-05 ENCOUNTER — Emergency Department (HOSPITAL_COMMUNITY)
Admission: EM | Admit: 2021-06-05 | Discharge: 2021-06-05 | Disposition: A | Discharge status: Home / Self Care | Payer: Self-pay | Attending: Emergency Medicine | Admitting: Emergency Medicine

## 2021-06-05 DIAGNOSIS — L089 Local infection of the skin and subcutaneous tissue, unspecified: Secondary | ICD-10-CM | POA: Insufficient documentation

## 2021-06-05 DIAGNOSIS — L02512 Cutaneous abscess of left hand: Secondary | ICD-10-CM | POA: Insufficient documentation

## 2021-06-05 MED ORDER — LIDOCAINE HCL (PF) 1 % IJ SOLN
5.0000 mL | Freq: Once | INTRAMUSCULAR | Status: AC
  Administered 2021-06-05: 5 mL via INTRADERMAL
  Filled 2021-06-05: qty 5

## 2021-06-05 MED ORDER — AMOXICILLIN-POT CLAVULANATE 875-125 MG PO TABS: 1.0000 | ORAL_TABLET | Freq: Two times a day (BID) | ORAL | 0 refills | Status: DC

## 2021-06-05 NOTE — Discharge Instructions (Addendum)
It appears that you have a wound infection that I did open up to help provide drainage.  Please cleanse wound daily with gentle soap and water, pat it dry, apply Neosporin and a dressing over the wound and continue to take antibiotic as prescribed.  You may need to follow-up with hand specialist in the next few days if you notice no improvement.

## 2021-06-05 NOTE — ED Triage Notes (Signed)
Patient here for evaluation of left index finger. Patient cut the finger one month ago with a knife, had the wound stitched and was prescribed an antibiotic. Patient complains today of swelling and pain to the finger. Patient is afebrile, alert, oriented, and in no apparent distress at this time.

## 2021-06-05 NOTE — ED Provider Notes (Signed)
[facility] EMERGENCY DEPARTMENT Provider Note   CSN: [id number] Arrival date & time: 06/05/21  1337     History  Chief Complaint  Patient presents with   Wound Check    Tammy Vargas is a 41 y.o. female.  The history is provided by the patient. No language interpreter was used.  Wound Check  41 year old female presenting for wound check.  Patient accidentally cut into the left index finger about a month ago.  Reports she had the wound sutured and was prescribed antibiotic that she took for the full duration.  Unfortunately patient reports that the wound never fully healed and it appears to be infected.  She endorsed increasing pain swelling and drainage coming from the wound.  Pain is sharp throbbing moderate in severity without any associate numbness.  She went to urgent care a week ago for her complaint and was placed on antibiotic but she noticed no improvement thus prompting this ER visit.  She is up-to-date with tetanus.  She denies any other injury.    Home Medications Prior to Admission medications   Medication Sig Start Date End Date Taking? Authorizing Provider  ibuprofen (ADVIL) 800 MG tablet Take 1 tablet (800 mg total) by mouth every 8 (eight) hours as needed (pain). 05/12/21   Barrett Henle, MD  methocarbamol (ROBAXIN) 500 MG tablet Take 1 tablet (500 mg total) by mouth at bedtime. 05/11/21   Lamptey, Myrene Galas, MD      Allergies    Patient has no known allergies.    Review of Systems   Review of Systems  Skin:  Positive for wound.   Physical Exam Updated Vital Signs BP (!) 154/101 (BP Location: Left Arm)    Pulse 77    Temp 98.8 F (37.1 C) (Oral)    Resp 16    LMP  (Within Weeks)    SpO2 100%  Physical Exam Vitals and nursing note reviewed.  Constitutional:      General: She is not in acute distress.    Appearance: She is well-developed.  HENT:     Head: Atraumatic.  Eyes:     Conjunctiva/sclera: Conjunctivae normal.  Pulmonary:      Effort: Pulmonary effort is normal.  Musculoskeletal:        General: Signs of injury (Left index finger: An area of induration with fluctuant noted overlying a previous laceration to the mid phalanx volarly, oozing fluid.  Brisk cap refill.  Sensation intact) present.     Cervical back: Neck supple.  Skin:    Findings: No rash.  Neurological:     Mental Status: She is alert.  Psychiatric:        Mood and Affect: Mood normal.    ED Results / Procedures / Treatments   Labs (all labs ordered are listed, but only abnormal results are displayed) Labs Reviewed - No data to display  EKG None  Radiology No results found.  Procedures .Marland KitchenIncision and Drainage  Date/Time: 06/05/2021 3:32 PM Performed by: Domenic Moras, PA-C Authorized by: Domenic Moras, PA-C   Consent:    Consent obtained:  Verbal   Consent given by:  Patient   Risks discussed:  Bleeding, incomplete drainage, pain and damage to other organs   Alternatives discussed:  No treatment Universal protocol:    Procedure explained and questions answered to patient or proxy's satisfaction: yes     Relevant documents present and verified: yes     Test results available : yes  Imaging studies available: yes     Required blood products, implants, devices, and special equipment available: yes     Site/side marked: yes     Immediately prior to procedure, a time out was called: yes     Patient identity confirmed:  Verbally with patient Location:    Type:  Abscess   Size:  1   Location:  Upper extremity   Upper extremity location:  Finger   Finger location:  L index finger Pre-procedure details:    Skin preparation:  Betadine Anesthesia:    Anesthesia method:  Local infiltration   Local anesthetic:  Lidocaine 1% w/o epi Procedure type:    Complexity:  Simple Procedure details:    Incision types:  Single straight   Incision depth:  Subcutaneous   Wound management:  Probed and deloculated, irrigated with saline and  extensive cleaning   Drainage:  Purulent   Drainage amount:  Scant   Wound treatment:  Wound left open   Packing materials:  None Post-procedure details:    Procedure completion:  Tolerated well, no immediate complications    Medications Ordered in ED Medications  lidocaine (PF) (XYLOCAINE) 1 % injection 5 mL (5 mLs Intradermal Given by Other 06/05/21 1443)    ED Course/ Medical Decision Making/ A&P                           Medical Decision Making Risk Prescription drug management.   BP (!) 154/101 (BP Location: Left Arm)    Pulse 77    Temp 98.8 F (37.1 C) (Oral)    Resp 16    LMP  (Within Weeks)    SpO2 100%   2:41 PM Patient suffered a laceration to the volar aspect of her left index finger overlying the middle phalanx approximately month ago.  Wound was sutured however it appears to be become increasingly more swollen and oozing out fluid consistent with a wound infection.  It is tender to palpation.  No joint involvement.  This wound will need to be incised and drained as I am concerning for an abscess.  She is up-to-date with tetanus  3:33 PM I have performed incision and drainage of the wound and expressed scant purulent as well as serous discharge.  I have provide appropriate dressing.  Will discharge home with antibiotic but encourage patient to follow-up with hand specialist for further management if no improvement.  Wound care instruction provided.  Patient report improvement of symptoms after receiving treatment.  I do not appreciate any nerve or tendon involvement I do not appreciate any bony involvement.        Final Clinical Impression(s) / ED Diagnoses Final diagnoses:  Wound infection    Rx / DC Orders ED Discharge Orders          Ordered    amoxicillin-clavulanate (AUGMENTIN) 875-125 MG tablet  Every 12 hours        06/05/21 1534              Domenic Moras, PA-C 06/06/21 0175    Dorie Rank, MD 06/06/21 1505

## 2021-07-06 ENCOUNTER — Other Ambulatory Visit: Payer: Self-pay

## 2021-07-06 ENCOUNTER — Emergency Department (HOSPITAL_COMMUNITY)
Admission: EM | Admit: 2021-07-06 | Discharge: 2021-07-07 | Disposition: A | Discharge status: Home / Self Care | Payer: Self-pay | Attending: Emergency Medicine | Admitting: Emergency Medicine

## 2021-07-06 ENCOUNTER — Encounter (HOSPITAL_COMMUNITY): Payer: Self-pay | Admitting: Emergency Medicine

## 2021-07-06 ENCOUNTER — Emergency Department (HOSPITAL_COMMUNITY): Payer: Self-pay

## 2021-07-06 DIAGNOSIS — L251 Unspecified contact dermatitis due to drugs in contact with skin: Secondary | ICD-10-CM | POA: Insufficient documentation

## 2021-07-06 LAB — CBC WITH DIFFERENTIAL/PLATELET
Abs Immature Granulocytes: 0.03 10*3/uL (ref 0.00–0.07)
Basophils Absolute: 0 10*3/uL (ref 0.0–0.1)
Basophils Relative: 0 %
Eosinophils Absolute: 0.2 10*3/uL (ref 0.0–0.5)
Eosinophils Relative: 2 %
HCT: 39.9 % (ref 36.0–46.0)
Hemoglobin: 13.1 g/dL (ref 12.0–15.0)
Immature Granulocytes: 0 %
Lymphocytes Relative: 43 %
Lymphs Abs: 4.5 10*3/uL — ABNORMAL HIGH (ref 0.7–4.0)
MCH: 29.2 pg (ref 26.0–34.0)
MCHC: 32.8 g/dL (ref 30.0–36.0)
MCV: 89.1 fL (ref 80.0–100.0)
Monocytes Absolute: 0.7 10*3/uL (ref 0.1–1.0)
Monocytes Relative: 7 %
Neutro Abs: 5.1 10*3/uL (ref 1.7–7.7)
Neutrophils Relative %: 48 %
Platelets: 318 10*3/uL (ref 150–400)
RBC: 4.48 MIL/uL (ref 3.87–5.11)
RDW: 13.5 % (ref 11.5–15.5)
WBC: 10.6 10*3/uL — ABNORMAL HIGH (ref 4.0–10.5)
nRBC: 0 % (ref 0.0–0.2)

## 2021-07-06 LAB — BASIC METABOLIC PANEL
Anion gap: 7 (ref 5–15)
BUN: 11 mg/dL (ref 6–20)
CO2: 24 mmol/L (ref 22–32)
Calcium: 9.4 mg/dL (ref 8.9–10.3)
Chloride: 106 mmol/L (ref 98–111)
Creatinine, Ser: 0.84 mg/dL (ref 0.44–1.00)
GFR, Estimated: >60 mL/min (ref >60)
Glucose, Bld: 83 mg/dL (ref 70–99)
Potassium: 3.8 mmol/L (ref 3.5–5.1)
Sodium: 137 mmol/L (ref 135–145)

## 2021-07-06 LAB — LACTIC ACID, PLASMA: Lactic Acid, Venous: 1.1 mmol/L (ref 0.5–1.9)

## 2021-07-06 NOTE — ED Provider Triage Note (Signed)
Emergency Medicine Provider Triage Evaluation Note ? ?Tammy Vargas , a 41 y.o. female  was evaluated in triage.  Pt complains of pain and swelling to left index finger.  Reports that she has had swelling and pain to the finger since being seen in the emergency department on 2/24 when she had I&D performed for paronychia.  Patient reports that swelling and pain has gotten progressively worse since then.  Patient endorses decree sensation to the distal tip of her affected digit.  Patient is right-hand dominant ? ?Review of Systems  ?Positive: Wound, swelling, pain, numbness ?Negative: Weakness, fever, chills ? ?Physical Exam  ?BP (!) 143/97 (BP Location: Right Arm)   Pulse 90   Temp 98.8 ?F (37.1 ?C) (Oral)   Resp 16   SpO2 99%  ?Gen:   Awake, no distress   ?Resp:  Normal effort  ?MSK:   Moves extremities without difficulty  ?Other:  Patient has wound noted to left index finger.  Diffuse swelling and erythema.  Tender to palpation throughout affected digit.  Decreased range of motion secondary to swelling. ? ?Medical Decision Making  ?Medically screening exam initiated at 5:57 PM.  Appropriate orders placed.  Aydia Maj was informed that the remainder of the evaluation will be completed by another provider, this initial triage assessment does not replace that evaluation, and the importance of remaining in the ED until their evaluation is complete. ? ? ?  ?Loni Beckwith, PA-C ?07/06/21 1759 ? ?

## 2021-07-06 NOTE — ED Triage Notes (Signed)
Patient coming from home, complaint of infection on index finger of left hand. State she cut it and had it stitched here but has never healed properly, now finger is red and swollen. ?

## 2021-07-06 NOTE — Discharge Instructions (Signed)
You were seen today for ongoing concerns for wound infection.  I think the changes in your finger may actually be related to a reaction to the Neosporin.  Stop using Neosporin.  You may use bacitracin.  Keep wound dry.  I do not think there is indication for antibiotics at this time.  If wound does not improving after stopping neomycin, follow-up with hand surgeon as above. ?

## 2021-07-06 NOTE — ED Provider Notes (Signed)
?[facility] ?Provider Note ? ? ?CSN: [id number] ?Arrival date & time: 07/06/21  1620 ? ?  ? ?History ? ?Chief Complaint  ?Patient presents with  ? Wound Infection  ? ? ?Tammy Vargas is a 41 y.o. female. ? ?HPI ? ?  ? ?This is a 41 year old female with no significant past medical history who presents with concerns for finger infection.  Patient reports that she had a laceration to her finger back in January.  It was sutured here in the emergency department.  Since that time she has been on 2 rounds of antibiotics.  She is concerned that it is still infected.  At 1 point she had an incision and drainage of the laceration site.  She uses Neosporin topically currently but no other meds.  She notes some clear drainage occasionally.  She is right-handed.  She has not had any fevers or systemic symptoms. ? ?Home Medications ?Prior to Admission medications   ?Medication Sig Start Date End Date Taking? Authorizing Provider  ?amoxicillin-clavulanate (AUGMENTIN) 875-125 MG tablet Take 1 tablet by mouth every 12 (twelve) hours. 06/05/21   Domenic Moras, PA-C  ?ibuprofen (ADVIL) 800 MG tablet Take 1 tablet (800 mg total) by mouth every 8 (eight) hours as needed (pain). 05/12/21   Barrett Henle, MD  ?methocarbamol (ROBAXIN) 500 MG tablet Take 1 tablet (500 mg total) by mouth at bedtime. 05/11/21   Lamptey, Myrene Galas, MD  ?   ? ?Allergies    ?Patient has no known allergies.   ? ?Review of Systems   ?Review of Systems  ?Constitutional:  Negative for fever.  ?Skin:  Positive for rash and wound.  ?All other systems reviewed and are negative. ? ?Physical Exam ?Updated Vital Signs ?BP (!) 148/91 (BP Location: Right Arm)   Pulse 78   Temp 98 ?F (36.7 ?C) (Oral)   Resp 18   LMP 06/14/2021 (Exact Date)   SpO2 99%  ?Physical Exam ?Vitals and nursing note reviewed.  ?Constitutional:   ?   Appearance: She is well-developed. She is not ill-appearing.  ?HENT:  ?   Head: Normocephalic and atraumatic.   ?   Mouth/Throat:  ?   Mouth: Mucous membranes are moist.  ?Eyes:  ?   Pupils: Pupils are equal, round, and reactive to light.  ?Cardiovascular:  ?   Rate and Rhythm: Normal rate and regular rhythm.  ?Pulmonary:  ?   Effort: Pulmonary effort is normal. No respiratory distress.  ?Abdominal:  ?   Palpations: Abdomen is soft.  ?Musculoskeletal:  ?   Cervical back: Neck supple.  ?   Comments: Focused examination of the left hand with thickening and darkening of the skin on the left second digit, there is slight swelling, there is some granular tissue medially, no active drainage, no fluctuance or induration, flexion and extension of the digit intact  ?Skin: ?   General: Skin is warm and dry.  ?Neurological:  ?   Mental Status: She is alert and oriented to person, place, and time.  ?Psychiatric:     ?   Mood and Affect: Mood normal.  ? ? ? ?ED Results / Procedures / Treatments   ?Labs ?(all labs ordered are listed, but only abnormal results are displayed) ?Labs Reviewed  ?CBC WITH DIFFERENTIAL/PLATELET - Abnormal; Notable for the following components:  ?    Result Value  ? WBC 10.6 (*)   ? Lymphs Abs 4.5 (*)   ? All other components within normal limits  ?  BASIC METABOLIC PANEL  ?LACTIC ACID, PLASMA  ?LACTIC ACID, PLASMA  ?PATHOLOGIST SMEAR REVIEW  ? ? ?EKG ?None ? ?Radiology ?DG Finger Index Left ? ?Result Date: 07/06/2021 ?CLINICAL DATA:  Infection LEFT index finger, laceration 1 month ago, sutured, has not healed well and is now red and swollen EXAM: LEFT INDEX FINGER 2+V COMPARISON:  None FINDINGS: Diffuse soft tissue swelling LEFT index finger. Osseous mineralization normal. Joint spaces preserved. No acute fracture, dislocation, or bone destruction. No radiopaque foreign bodies or soft tissue gas. IMPRESSION: Soft tissue swelling without osseous abnormalities. Electronically Signed   By: Lavonia Dana M.D.   On: 07/06/2021 18:53   ? ?Procedures ?Procedures  ? ? ?Medications Ordered in ED ?Medications - No data to  display ? ?ED Course/ Medical Decision Making/ A&P ?  ?                        ?Medical Decision Making ? ?This patient presents to the ED for concern of finger infection, this involves an extensive number of treatment options, and is a complaint that carries with it a high risk of complications and morbidity.  The differential diagnosis includes infection, contact dermatitis secondary to Neosporin, osteo ? ?MDM:   ? ?This is a 41 year old female who presents with concerns for finger infection.  She is nontoxic and vital signs notable for blood pressure 148/91.  She is nontoxic-appearing.  I reviewed her chart.  She has been seen at both urgent care and in the emergency room.  Since the initial laceration she has had a course of amoxicillin and Keflex.  On my exam today, she mostly has thickening and discoloration of the skin adjacent to the original wound site.  There are some granulation tissue at the wound site.  No fluctuance or induration.  She has been using Neosporin.  High clinical suspicion for contact dermatitis related to neomycin.  Labs obtained and reviewed from triage.  These are largely reassuring.  No significant leukocytosis.  She has been afebrile.  Lower suspicion for acute infection.  X-ray shows no evidence of bony erosion or osteomyelitis. ?(Labs, imaging) ? ?Labs: ?I Ordered, and personally interpreted labs.  The pertinent results include: CBC, BMP ? ?Imaging Studies ordered: ?I ordered imaging studies including x-ray negative ?I independently visualized and interpreted imaging. ?I agree with the radiologist interpretation ? ?Additional history obtained from chart review.  External records from outside source obtained and reviewed including prior evaluations in the ED and urgent care ? ?Critical Interventions: ?N/A ? ?Consultations: ?I requested consultation with the NA,  and discussed lab and imaging findings as well as pertinent plan - they recommend: N/A ? ?Cardiac Monitoring: ?The patient  was maintained on a cardiac monitor.  I personally viewed and interpreted the cardiac monitored which showed an underlying rhythm of: Sinus rhythm ? ?Reevaluation: ?After the interventions noted above, I reevaluated the patient and found that they have :stayed the same ? ? ?Considered admission for: N/A ? ?Social Determinants of Health: ?Lives independently ? ?Disposition: Discharge ? ?Co morbidities that complicate the patient evaluation ?History reviewed. No pertinent past medical history.  ? ?Medicines ?No orders of the defined types were placed in this encounter. ?  ?I have reviewed the patients home medicines and have made adjustments as needed ? ?Problem List / ED Course: ?Problem List Items Addressed This Visit   ?None ?Visit Diagnoses   ? ? Contact dermatitis due to neomycin    -  Primary  ? ?  ?  ? ? ? ? ? ? ? ? ? ? ? ? ?  Final Clinical Impression(s) / ED Diagnoses ?Final diagnoses:  ?Contact dermatitis due to neomycin  ? ? ?Rx / DC Orders ?ED Discharge Orders   ? ? None  ? ?  ? ? ?  ?Merryl Hacker, MD ?07/06/21 2351 ? ?

## 2021-07-07 NOTE — ED Notes (Signed)
Pt verbalized understanding of d/c instructions, meds, and followup care. Denies questions. VSS, no distress noted. Steady gait to exit with all belongings.  ?

## 2021-07-08 LAB — PATHOLOGIST SMEAR REVIEW: Path Review: REACTIVE

## 2022-01-04 ENCOUNTER — Ambulatory Visit: Payer: Self-pay | Attending: Nurse Practitioner | Admitting: Family Medicine

## 2022-01-04 ENCOUNTER — Encounter: Payer: Self-pay | Admitting: Family Medicine

## 2022-01-04 ENCOUNTER — Other Ambulatory Visit (HOSPITAL_COMMUNITY)
Admission: RE | Admit: 2022-01-04 | Discharge: 2022-01-04 | Disposition: A | Discharge status: Home / Self Care | Payer: Medicaid Other | Source: Ambulatory Visit | Attending: Nurse Practitioner | Admitting: Nurse Practitioner

## 2022-01-04 VITALS — BP 157/99 | HR 73 | Temp 98.4°F | Ht 62.0 in | Wt 164.6 lb

## 2022-01-04 DIAGNOSIS — N898 Other specified noninflammatory disorders of vagina: Secondary | ICD-10-CM

## 2022-01-04 DIAGNOSIS — I10 Essential (primary) hypertension: Secondary | ICD-10-CM | POA: Insufficient documentation

## 2022-01-04 DIAGNOSIS — F17210 Nicotine dependence, cigarettes, uncomplicated: Secondary | ICD-10-CM

## 2022-01-04 DIAGNOSIS — R339 Retention of urine, unspecified: Secondary | ICD-10-CM

## 2022-01-04 HISTORY — DX: Essential (primary) hypertension: I10

## 2022-01-04 LAB — POCT URINALYSIS DIP (CLINITEK)
Bilirubin, UA: NEGATIVE
Glucose, UA: NEGATIVE mg/dL
Ketones, POC UA: NEGATIVE mg/dL
Leukocytes, UA: NEGATIVE
Nitrite, UA: NEGATIVE
POC PROTEIN,UA: NEGATIVE
Spec Grav, UA: 1.02 (ref 1.010–1.025)
Urobilinogen, UA: 0.2 E.U./dL
pH, UA: 7 (ref 5.0–8.0)

## 2022-01-04 MED ORDER — BUPROPION HCL ER (XL) 150 MG PO TB24: 150.0000 mg | ORAL_TABLET | Freq: Every day | ORAL | 1 refills | Status: DC

## 2022-01-04 MED ORDER — AMLODIPINE BESYLATE 5 MG PO TABS: 5.0000 mg | ORAL_TABLET | Freq: Every day | ORAL | 1 refills | Status: DC

## 2022-01-04 NOTE — Progress Notes (Addendum)
Subjective:  Patient ID: Tammy Vargas, female    DOB: Nov 11, 1980  Age: 41 y.o. MRN: [id number]  CC: Establish Care (New patient/ Est care. Lower abdominal pain and intermitten painful urination)   HPI Tammy Vargas is a 41 y.o. year old female who presents today to establish care.   Interval History:  For the last 2 months she has had off and on symptoms of urinary retension, frequency, sharp lower abdominal pain, sometimes flank pain. Also had clear vaginal discharge but denies presence of a fever. She has no nausea, vomiting, diarrhea or constipation.  BP is elevated and she endorses a previous history of elevated blood pressures at other office visit and a family history of hypertension but she has never been diagnosed with hypertension.  Smokes 1 pack Cigarettes/ day.  History reviewed. No pertinent past medical history.  History reviewed. No pertinent surgical history.  Family History  Problem Relation Age of Onset   Hypertension Mother    Hypertension Father     Social History   Socioeconomic History   Marital status: Single    Spouse name: Not on file   Number of children: Not on file   Years of education: Not on file   Highest education level: Not on file  Occupational History   Not on file  Tobacco Use   Smoking status: Every Day    Packs/day: 0.22    Types: Cigarettes   Smokeless tobacco: Never  Vaping Use   Vaping Use: Never used  Substance and Sexual Activity   Alcohol use: Yes    Comment: occ   Drug use: Yes    Types: Marijuana   Sexual activity: Not on file  Other Topics Concern   Not on file  Social History Narrative   Not on file   Social Determinants of Health   Financial Resource Strain: Not on file  Food Insecurity: Not on file  Transportation Needs: Not on file  Physical Activity: Not on file  Stress: Not on file  Social Connections: Not on file    No Known Allergies  Outpatient Medications Prior to Visit  Medication Sig  Dispense Refill   amoxicillin-clavulanate (AUGMENTIN) 875-125 MG tablet Take 1 tablet by mouth every 12 (twelve) hours. (Patient not taking: Reported on 01/04/2022) 14 tablet 0   ibuprofen (ADVIL) 800 MG tablet Take 1 tablet (800 mg total) by mouth every 8 (eight) hours as needed (pain). (Patient not taking: Reported on 01/04/2022) 21 tablet 0   methocarbamol (ROBAXIN) 500 MG tablet Take 1 tablet (500 mg total) by mouth at bedtime. (Patient not taking: Reported on 01/04/2022) 20 tablet 0   No facility-administered medications prior to visit.     ROS Review of Systems  Constitutional:  Negative for activity change, appetite change and fatigue.  HENT:  Negative for congestion, sinus pressure and sore throat.   Eyes:  Negative for visual disturbance.  Respiratory:  Negative for cough, chest tightness, shortness of breath and wheezing.   Cardiovascular:  Negative for chest pain and palpitations.  Gastrointestinal:  Positive for abdominal pain. Negative for abdominal distention and constipation.  Endocrine: Negative for polydipsia.  Genitourinary:  Positive for flank pain. Negative for dysuria and frequency.  Musculoskeletal:  Negative for arthralgias and back pain.  Skin:  Negative for rash.  Neurological:  Negative for tremors, light-headedness and numbness.  Hematological:  Does not bruise/bleed easily.  Psychiatric/Behavioral:  Negative for agitation and behavioral problems.     Objective:  BP (!) 157/99 (BP  Location: Left Arm, Patient Position: Sitting, Cuff Size: Normal)   Pulse 73   Temp 98.4 F (36.9 C) (Oral)   Ht '5\' 2"'$  (1.575 m)   Wt 164 lb 9.6 oz (74.7 kg)   SpO2 99%   BMI 30.11 kg/m      01/04/2022    1:51 PM 07/06/2021   11:37 PM 07/06/2021    8:23 PM  BP/Weight  Systolic BP 962 952 841  Diastolic BP 99 91 324  Wt. (Lbs) 164.6    BMI 30.11 kg/m2        Physical Exam Constitutional:      Appearance: She is well-developed.  Cardiovascular:     Rate and Rhythm:  Normal rate.     Heart sounds: Normal heart sounds. No murmur heard. Pulmonary:     Effort: Pulmonary effort is normal.     Breath sounds: Normal breath sounds. No wheezing or rales.  Chest:     Chest wall: No tenderness.  Abdominal:     General: Bowel sounds are normal. There is distension (suprapubic).     Palpations: Abdomen is soft. There is no mass.     Tenderness: There is abdominal tenderness (slight right-sided suprapubic tenderness). There is no right CVA tenderness or left CVA tenderness.  Musculoskeletal:        General: Normal range of motion.     Right lower leg: No edema.     Left lower leg: No edema.  Neurological:     Mental Status: She is alert and oriented to person, place, and time.  Psychiatric:        Mood and Affect: Mood normal.        Latest Ref Rng & Units 07/06/2021    6:08 PM  CMP  Glucose 70 - 99 mg/dL 83   BUN 6 - 20 mg/dL 11   Creatinine 0.44 - 1.00 mg/dL 0.84   Sodium 135 - 145 mmol/L 137   Potassium 3.5 - 5.1 mmol/L 3.8   Chloride 98 - 111 mmol/L 106   CO2 22 - 32 mmol/L 24   Calcium 8.9 - 10.3 mg/dL 9.4     Lipid Panel  No results found for: "CHOL", "TRIG", "HDL", "CHOLHDL", "VLDL", "LDLCALC", "LDLDIRECT"  CBC    Component Value Date/Time   WBC 10.6 (H) 07/06/2021 1808   RBC 4.48 07/06/2021 1808   HGB 13.1 07/06/2021 1808   HCT 39.9 07/06/2021 1808   PLT 318 07/06/2021 1808   MCV 89.1 07/06/2021 1808   MCH 29.2 07/06/2021 1808   MCHC 32.8 07/06/2021 1808   RDW 13.5 07/06/2021 1808   LYMPHSABS 4.5 (H) 07/06/2021 1808   MONOABS 0.7 07/06/2021 1808   EOSABS 0.2 07/06/2021 1808   BASOSABS 0.0 07/06/2021 1808    No results found for: "HGBA1C"  Assessment & Plan:  1. Primary hypertension Uncontrolled We will initiate amlodipine Counseled on blood pressure goal of less than 130/80, low-sodium, DASH diet, medication compliance, 150 minutes of moderate intensity exercise per week. Discussed medication compliance, adverse  effects.  - amLODipine (NORVASC) 5 MG tablet; Take 1 tablet (5 mg total) by mouth daily.  Dispense: 90 tablet; Refill: 1  2. Vaginal discharge - Cervicovaginal ancillary only  3. Urinary retention UA reveals presence of moderate blood but no evidence of UTI No CVA tenderness Pelvic ultrasound to evaluate for possible urinary retention If negative consider renal stone protocol - POCT URINALYSIS DIP (CLINITEK) - US Pelvis Limited  4. Cigarette nicotine dependence without complication Spent 3  minutes counseling on smoking cessation and she is willing to initiate pharmacotherapy for cessation - buPROPion (WELLBUTRIN XL) 150 MG 24 hr tablet; Take 1 tablet (150 mg total) by mouth daily. For smoking cessation  Dispense: 90 tablet; Refill: 1    Meds ordered this encounter  Medications   amLODipine (NORVASC) 5 MG tablet    Sig: Take 1 tablet (5 mg total) by mouth daily.    Dispense:  90 tablet    Refill:  1   buPROPion (WELLBUTRIN XL) 150 MG 24 hr tablet    Sig: Take 1 tablet (150 mg total) by mouth daily. For smoking cessation    Dispense:  90 tablet    Refill:  1    Follow-up: Return in about 3 months (around 04/05/2022) for Follow-up on hypertension.       Charlott Rakes, MD, FAAFP. [facility] and [place], [place]   01/04/2022, 3:42 PM

## 2022-01-04 NOTE — Patient Instructions (Signed)

## 2022-01-05 ENCOUNTER — Other Ambulatory Visit: Payer: Self-pay | Admitting: Family Medicine

## 2022-01-05 ENCOUNTER — Ambulatory Visit (HOSPITAL_COMMUNITY)
Admission: RE | Admit: 2022-01-05 | Discharge: 2022-01-05 | Disposition: A | Discharge status: Home / Self Care | Payer: Medicaid Other | Source: Ambulatory Visit | Attending: Family Medicine | Admitting: Family Medicine

## 2022-01-05 DIAGNOSIS — D219 Benign neoplasm of connective and other soft tissue, unspecified: Secondary | ICD-10-CM

## 2022-01-05 DIAGNOSIS — R339 Retention of urine, unspecified: Secondary | ICD-10-CM | POA: Insufficient documentation

## 2022-01-05 LAB — CERVICOVAGINAL ANCILLARY ONLY
Bacterial Vaginitis (gardnerella): POSITIVE — AB
Candida Glabrata: NEGATIVE
Candida Vaginitis: NEGATIVE
Chlamydia: NEGATIVE
Comment: NEGATIVE
Comment: NEGATIVE
Comment: NEGATIVE
Comment: NEGATIVE
Comment: NEGATIVE
Comment: NORMAL
Neisseria Gonorrhea: NEGATIVE
Trichomonas: NEGATIVE

## 2022-01-05 MED ORDER — METRONIDAZOLE 500 MG PO TABS
500.0000 mg | ORAL_TABLET | Freq: Two times a day (BID) | ORAL | 0 refills | Status: AC
Start: 2022-01-05 — End: 2022-01-12

## 2022-02-09 ENCOUNTER — Ambulatory Visit: Payer: Medicaid Other | Admitting: Nurse Practitioner

## 2022-03-12 DIAGNOSIS — Z419 Encounter for procedure for purposes other than remedying health state, unspecified: Secondary | ICD-10-CM | POA: Diagnosis not present

## 2022-04-06 ENCOUNTER — Ambulatory Visit: Payer: Medicaid Other | Admitting: Family Medicine

## 2022-04-08 ENCOUNTER — Ambulatory Visit: Payer: Medicaid Other | Admitting: Family Medicine

## 2022-04-12 DIAGNOSIS — Z419 Encounter for procedure for purposes other than remedying health state, unspecified: Secondary | ICD-10-CM | POA: Diagnosis not present

## 2022-05-05 ENCOUNTER — Encounter: Payer: Medicaid Other | Admitting: Obstetrics & Gynecology

## 2022-05-13 DIAGNOSIS — Z419 Encounter for procedure for purposes other than remedying health state, unspecified: Secondary | ICD-10-CM | POA: Diagnosis not present

## 2022-06-11 DIAGNOSIS — Z419 Encounter for procedure for purposes other than remedying health state, unspecified: Secondary | ICD-10-CM | POA: Diagnosis not present

## 2022-07-12 DIAGNOSIS — Z419 Encounter for procedure for purposes other than remedying health state, unspecified: Secondary | ICD-10-CM | POA: Diagnosis not present

## 2022-07-13 ENCOUNTER — Ambulatory Visit: Payer: Self-pay | Admitting: *Deleted

## 2022-07-13 NOTE — Telephone Encounter (Signed)
Noted  

## 2022-07-13 NOTE — Telephone Encounter (Signed)
  Chief Complaint: "Food gets stuck" Symptoms: States "Feels like food is stuck in throat or chest, like it never goes down the whole way. Something feels like it's there now"  Also reports "Pressure under left arm when lying down." States can still eat, hydrate, "Just doesn't go down the whole way." Frequency: 2 weeks Pertinent Negatives: Patient denies soreness,SOB, swelling Disposition: [] ED /[] Urgent Care (no appt availability in office) / [] Appointment(In office/virtual)/ []  [facility]/ [] Home Care/ [] Refused Recommended Disposition /[x] [facility]/ []  Follow-up with PCP Additional Notes: No availability at practice, advised [facility], states cannot get off work at those times and will go to UC. Care advise provided, pt verbalizes understanding.  Reason for Disposition  SEVERE (e.g., excruciating) throat pain    "Feels like food is stuck, doesn't go down."  Answer Assessment - Initial Assessment Questions 1. ONSET: "When did the throat start hurting?" (Hours or days ago)      2 weeks 2. SEVERITY: "How bad is the sore throat?" (Scale 1-10; mild, moderate or severe)   - MILD (1-3):  Doesn't interfere with eating or normal activities.   - MODERATE (4-7): Interferes with eating some solids and normal activities.   - SEVERE (8-10):  Excruciating pain, interferes with most normal activities.   - SEVERE WITH DYSPHAGIA (10): Can't swallow liquids, drooling.     "Feels like food gets stuck" 3. STREP EXPOSURE: "Has there been any exposure to strep within the past week?" If Yes, ask: "What type of contact occurred?"      NA 4.  VIRAL SYMPTOMS: "Are there any symptoms of a cold, such as a runny nose, cough, hoarse voice or red eyes?"      NA 5. FEVER: "Do you have a fever?" If Yes, ask: "What is your temperature, how was it measured, and when did it start?"     NA 6. PUS ON THE TONSILS: "Is there pus on the tonsils in the back of your throat?"     NA 7. OTHER  SYMPTOMS: "Do you have any other symptoms?" (e.g., difficulty breathing, headache, rash)     Left arm, under arm feels pressure when lying down  Protocols used: Sore Throat-A-AH

## 2022-08-11 DIAGNOSIS — Z419 Encounter for procedure for purposes other than remedying health state, unspecified: Secondary | ICD-10-CM | POA: Diagnosis not present

## 2022-08-16 ENCOUNTER — Encounter: Payer: Medicaid Other | Admitting: Family Medicine

## 2022-09-11 DIAGNOSIS — Z419 Encounter for procedure for purposes other than remedying health state, unspecified: Secondary | ICD-10-CM | POA: Diagnosis not present

## 2022-09-14 ENCOUNTER — Encounter: Payer: Medicaid Other | Admitting: Family Medicine

## 2022-10-11 DIAGNOSIS — Z419 Encounter for procedure for purposes other than remedying health state, unspecified: Secondary | ICD-10-CM | POA: Diagnosis not present

## 2022-10-27 ENCOUNTER — Encounter: Payer: Self-pay | Admitting: Family Medicine

## 2022-10-27 ENCOUNTER — Ambulatory Visit: Payer: Medicaid Other | Attending: Family Medicine | Admitting: Family Medicine

## 2022-10-27 ENCOUNTER — Other Ambulatory Visit: Payer: Self-pay | Admitting: Family Medicine

## 2022-10-27 VITALS — BP 149/90 | HR 82 | Temp 98.0°F | Ht 62.0 in | Wt 162.0 lb

## 2022-10-27 DIAGNOSIS — R21 Rash and other nonspecific skin eruption: Secondary | ICD-10-CM | POA: Diagnosis not present

## 2022-10-27 DIAGNOSIS — I10 Essential (primary) hypertension: Secondary | ICD-10-CM

## 2022-10-27 MED ORDER — TRIAMCINOLONE ACETONIDE 0.1 % EX CREA: 1.0000 | TOPICAL_CREAM | Freq: Two times a day (BID) | CUTANEOUS | 1 refills | Status: DC

## 2022-10-27 NOTE — Progress Notes (Signed)
Lower abdominal pain Rash on back and stomach Stopped Amlodipine

## 2022-10-27 NOTE — Patient Instructions (Signed)
Managing Your Hypertension Hypertension, also called high blood pressure, is when the force of the blood pressing against the walls of the arteries is too strong. Arteries are blood vessels that carry blood from your heart throughout your body. Hypertension forces the heart to work harder to pump blood and may cause the arteries to become narrow or stiff. Understanding blood pressure readings A blood pressure reading includes a higher number over a lower number: The first, or top, number is called the systolic pressure. It is a measure of the pressure in your arteries as your heart beats. The second, or bottom number, is called the diastolic pressure. It is a measure of the pressure in your arteries as the heart relaxes. For most people, a normal blood pressure is below 120/80. Your personal target blood pressure may vary depending on your medical conditions, your age, and other factors. Blood pressure is classified into four stages. Based on your blood pressure reading, your health care provider may use the following stages to determine what type of treatment you need, if any. Systolic pressure and diastolic pressure are measured in a unit called millimeters of mercury (mmHg). Normal Systolic pressure: below 120. Diastolic pressure: below 80. Elevated Systolic pressure: 120-129. Diastolic pressure: below 80. Hypertension stage 1 Systolic pressure: 130-139. Diastolic pressure: 80-89. Hypertension stage 2 Systolic pressure: 140 or above. Diastolic pressure: 90 or above. How can this condition affect me? Managing your hypertension is very important. Over time, hypertension can damage the arteries and decrease blood flow to parts of the body, including the brain, heart, and kidneys. Having untreated or uncontrolled hypertension can lead to: A heart attack. A stroke. A weakened blood vessel (aneurysm). Heart failure. Kidney damage. Eye damage. Memory and concentration problems. Vascular  dementia. What actions can I take to manage this condition? Hypertension can be managed by making lifestyle changes and possibly by taking medicines. Your health care provider will help you make a plan to bring your blood pressure within a normal range. You may be referred for counseling on a healthy diet and physical activity. Nutrition  Eat a diet that is high in fiber and potassium, and low in salt (sodium), added sugar, and fat. An example eating plan is called the DASH diet. DASH stands for Dietary Approaches to Stop Hypertension. To eat this way: Eat plenty of fresh fruits and vegetables. Try to fill one-half of your plate at each meal with fruits and vegetables. Eat whole grains, such as whole-wheat pasta, brown rice, or whole-grain bread. Fill about one-fourth of your plate with whole grains. Eat low-fat dairy products. Avoid fatty cuts of meat, processed or cured meats, and poultry with skin. Fill about one-fourth of your plate with lean proteins such as fish, chicken without skin, beans, eggs, and tofu. Avoid pre-made and processed foods. These tend to be higher in sodium, added sugar, and fat. Reduce your daily sodium intake. Many people with hypertension should eat less than 1,500 mg of sodium a day. Lifestyle  Work with your health care provider to maintain a healthy body weight or to lose weight. Ask what an ideal weight is for you. Get at least 30 minutes of exercise that causes your heart to beat faster (aerobic exercise) most days of the week. Activities may include walking, swimming, or biking. Include exercise to strengthen your muscles (resistance exercise), such as weight lifting, as part of your weekly exercise routine. Try to do these types of exercises for 30 minutes at least 3 days a week. Do   not use any products that contain nicotine or tobacco. These products include cigarettes, chewing tobacco, and vaping devices, such as e-cigarettes. If you need help quitting, ask your  health care provider. Control any long-term (chronic) conditions you have, such as high cholesterol or diabetes. Identify your sources of stress and find ways to manage stress. This may include meditation, deep breathing, or making time for fun activities. Alcohol use Do not drink alcohol if: Your health care provider tells you not to drink. You are pregnant, may be pregnant, or are planning to become pregnant. If you drink alcohol: Limit how much you have to: 0-1 drink a day for women. 0-2 drinks a day for men. Know how much alcohol is in your drink. In the [place] one drink equals one 12 oz bottle of beer (355 mL), one 5 oz glass of wine (148 mL), or one 1 oz glass of hard liquor (44 mL). Medicines Your health care provider may prescribe medicine if lifestyle changes are not enough to get your blood pressure under control and if: Your systolic blood pressure is 130 or higher. Your diastolic blood pressure is 80 or higher. Take medicines only as told by your health care provider. Follow the directions carefully. Blood pressure medicines must be taken as told by your health care provider. The medicine does not work as well when you skip doses. Skipping doses also puts you at risk for problems. Monitoring Before you monitor your blood pressure: Do not smoke, drink caffeinated beverages, or exercise within 30 minutes before taking a measurement. Use the bathroom and empty your bladder (urinate). Sit quietly for at least 5 minutes before taking measurements. Monitor your blood pressure at home as told by your health care provider. To do this: Sit with your back straight and supported. Place your feet flat on the floor. Do not cross your legs. Support your arm on a flat surface, such as a table. Make sure your upper arm is at heart level. Each time you measure, take two or three readings one minute apart and record the results. You may also need to have your blood pressure checked regularly by  your health care provider. General information Talk with your health care provider about your diet, exercise habits, and other lifestyle factors that may be contributing to hypertension. Review all the medicines you take with your health care provider because there may be side effects or interactions. Keep all follow-up visits. Your health care provider can help you create and adjust your plan for managing your high blood pressure. Where to find more information National Heart, Lung, and Blood Institute: www.nhlbi.nih.gov American Heart Association: www.heart.org Contact a health care provider if: You think you are having a reaction to medicines you have taken. You have repeated (recurrent) headaches. You feel dizzy. You have swelling in your ankles. You have trouble with your vision. Get help right away if: You develop a severe headache or confusion. You have unusual weakness or numbness, or you feel faint. You have severe pain in your chest or abdomen. You vomit repeatedly. You have trouble breathing. These symptoms may be an emergency. Get help right away. Call 911. Do not wait to see if the symptoms will go away. Do not drive yourself to the hospital. Summary Hypertension is when the force of blood pumping through your arteries is too strong. If this condition is not controlled, it may put you at risk for serious complications. Your personal target blood pressure may vary depending on your medical conditions,   your age, and other factors. For most people, a normal blood pressure is less than 120/80. Hypertension is managed by lifestyle changes, medicines, or both. Lifestyle changes to help manage hypertension include losing weight, eating a healthy, low-sodium diet, exercising more, stopping smoking, and limiting alcohol. This information is not intended to replace advice given to you by your health care provider. Make sure you discuss any questions you have with your health care  provider. Document Revised: 12/11/2020 Document Reviewed: 12/11/2020 Elsevier Patient Education  2024 Elsevier Inc.  

## 2022-10-27 NOTE — Progress Notes (Signed)
Subjective:  Patient ID: Tammy Vargas, female    DOB: 01-Mar-1981  Age: 42 y.o. MRN: [id number]  CC: Hypertension   HPI Tammy Vargas is a 42 y.o. year old female with a history of hypertension.  Interval History: Discussed the use of AI scribe software for clinical note transcription with the patient, who gave verbal consent to proceed.   The patient, with a history of hypertension, presents after discontinuing amlodipine due to dizziness and nausea.  On further questioning she was also taking Wellbutrin and is unsure if symptoms were secondary to Wellbutrin or amlodipine.  She denies monitoring her blood pressure at home.  She also reports a new rash on her back and arms that has been present for one to two weeks. The rash is itchy and symptoms are not worse at night.  She denies any changes in soap, cream, or laundry detergent and denies any known food allergies.       Past Medical History:  Diagnosis Date   Hypertension 01/04/2022    History reviewed. No pertinent surgical history.  Family History  Problem Relation Age of Onset   Hypertension Mother    Hypertension Father     Social History   Socioeconomic History   Marital status: Single    Spouse name: Not on file   Number of children: Not on file   Years of education: Not on file   Highest education level: Not on file  Occupational History   Not on file  Tobacco Use   Smoking status: Every Day    Current packs/day: 0.22    Types: Cigarettes   Smokeless tobacco: Never  Vaping Use   Vaping status: Never Used  Substance and Sexual Activity   Alcohol use: Yes    Comment: occ   Drug use: Yes    Types: Marijuana   Sexual activity: Not on file  Other Topics Concern   Not on file  Social History Narrative   Not on file   Social Determinants of Health   Financial Resource Strain: Not on file  Food Insecurity: Not on file  Transportation Needs: Not on file  Physical Activity: Not on file  Stress: Not on  file  Social Connections: Not on file    No Known Allergies  Outpatient Medications Prior to Visit  Medication Sig Dispense Refill   amLODipine (NORVASC) 5 MG tablet Take 1 tablet (5 mg total) by mouth daily. (Patient not taking: Reported on 10/27/2022) 90 tablet 1   buPROPion (WELLBUTRIN XL) 150 MG 24 hr tablet Take 1 tablet (150 mg total) by mouth daily. For smoking cessation (Patient not taking: Reported on 10/27/2022) 90 tablet 1   amoxicillin-clavulanate (AUGMENTIN) 875-125 MG tablet Take 1 tablet by mouth every 12 (twelve) hours. (Patient not taking: Reported on 01/04/2022) 14 tablet 0   ibuprofen (ADVIL) 800 MG tablet Take 1 tablet (800 mg total) by mouth every 8 (eight) hours as needed (pain). (Patient not taking: Reported on 01/04/2022) 21 tablet 0   methocarbamol (ROBAXIN) 500 MG tablet Take 1 tablet (500 mg total) by mouth at bedtime. (Patient not taking: Reported on 01/04/2022) 20 tablet 0   No facility-administered medications prior to visit.     ROS Review of Systems  Constitutional:  Negative for activity change and appetite change.  HENT:  Negative for sinus pressure and sore throat.   Respiratory:  Negative for chest tightness, shortness of breath and wheezing.   Cardiovascular:  Negative for chest pain and palpitations.  Gastrointestinal:  Negative for abdominal distention, abdominal pain and constipation.  Genitourinary: Negative.   Musculoskeletal: Negative.   Skin:  Positive for rash.  Psychiatric/Behavioral:  Negative for behavioral problems and dysphoric mood.     Objective:  BP (!) 146/92   Pulse 82   Temp 98 F (36.7 C) (Other (Comment))   Ht 5\' 2"  (1.575 m)   Wt 162 lb (73.5 kg)   SpO2 100%   BMI 29.63 kg/m      10/27/2022   10:19 AM 01/04/2022    1:51 PM 07/06/2021   11:37 PM  BP/Weight  Systolic BP 146 157 148  Diastolic BP 92 99 91  Wt. (Lbs) 162 164.6   BMI 29.63 kg/m2 30.11 kg/m2       Physical Exam Constitutional:      Appearance: She  is well-developed.  Cardiovascular:     Rate and Rhythm: Normal rate.     Heart sounds: Normal heart sounds. No murmur heard. Pulmonary:     Effort: Pulmonary effort is normal.     Breath sounds: Normal breath sounds. No wheezing or rales.  Chest:     Chest wall: No tenderness.  Abdominal:     General: Bowel sounds are normal. There is no distension.     Palpations: Abdomen is soft. There is no mass.     Tenderness: There is no abdominal tenderness.  Musculoskeletal:        General: Normal range of motion.     Right lower leg: No edema.     Left lower leg: No edema.  Skin:    Comments: Hyperpigmented scars on upper part of back.  Nodules on lower back and upper arms  Neurological:     Mental Status: She is alert and oriented to person, place, and time.  Psychiatric:        Mood and Affect: Mood normal.        Latest Ref Rng & Units 07/06/2021    6:08 PM  CMP  Glucose 70 - 99 mg/dL 83   BUN 6 - 20 mg/dL 11   Creatinine 2.84 - 1.00 mg/dL 1.32   Sodium 440 - 102 mmol/L 137   Potassium 3.5 - 5.1 mmol/L 3.8   Chloride 98 - 111 mmol/L 106   CO2 22 - 32 mmol/L 24   Calcium 8.9 - 10.3 mg/dL 9.4     Lipid Panel  No results found for: "CHOL", "TRIG", "HDL", "CHOLHDL", "VLDL", "LDLCALC", "LDLDIRECT"  CBC    Component Value Date/Time   WBC 10.6 (H) 07/06/2021 1808   RBC 4.48 07/06/2021 1808   HGB 13.1 07/06/2021 1808   HCT 39.9 07/06/2021 1808   PLT 318 07/06/2021 1808   MCV 89.1 07/06/2021 1808   MCH 29.2 07/06/2021 1808   MCHC 32.8 07/06/2021 1808   RDW 13.5 07/06/2021 1808   LYMPHSABS 4.5 (H) 07/06/2021 1808   MONOABS 0.7 07/06/2021 1808   EOSABS 0.2 07/06/2021 1808   BASOSABS 0.0 07/06/2021 1808    No results found for: "HGBA1C"  Assessment & Plan:      Hypertension: Elevated blood pressure in the setting of discontinuation of amlodipine due to side effects (dizziness, nausea ). Discussed the importance of consistent blood pressure monitoring at home and the  need to find a tolerable antihypertensive medication. -She is unsure if dizziness and nausea was related to amlodipine or Wellbutrin as she was taking both at the time -Resume amlodipine 5mg  daily and monitor for side effects. -If side effects persist, consider  halving the dose to 2.5mg  daily. -Obtain a home blood pressure monitor and check blood pressure regularly. -Consider losartan 25mg  daily as an alternative if amlodipine is not tolerated.  Rash: Unexplained rash on back and arms for the past 1-2 weeks. No known allergies or changes in soap, cream, or laundry detergent. No itching in fingers, thighs, groin, or buttocks. -Prescribe cinnamon for application to affected areas. -Consider further dermatologic evaluation if no improvement.   General Health Maintenance / Followup Plans: -Order comprehensive metabolic panel to assess kidney and liver function. -Plan for mammogram and Pap smear at next visit in one month. -If patient is able to fast, consider checking cholesterol at next visit.           Meds ordered this encounter  Medications   triamcinolone cream (KENALOG) 0.1 %    Sig: Apply 1 Application topically 2 (two) times daily.    Dispense:  30 g    Refill:  1    Follow-up: Return in about 1 month (around 11/27/2022) for CPE/ Preventive Health Exam.       Hoy Register, MD, FAAFP. [facility] [place], [place] [phone]   10/27/2022, 10:39 AM

## 2022-10-28 LAB — CMP14+EGFR
ALT: 13 IU/L (ref 0–32)
AST: 12 IU/L (ref 0–40)
Albumin: 3.9 g/dL (ref 3.9–4.9)
Alkaline Phosphatase: 98 IU/L (ref 44–121)
BUN/Creatinine Ratio: 9 (ref 9–23)
BUN: 9 mg/dL (ref 6–24)
Bilirubin Total: <0.2 mg/dL (ref 0.0–1.2)
CO2: 20 mmol/L (ref 20–29)
Calcium: 8.6 mg/dL — ABNORMAL LOW (ref 8.7–10.2)
Chloride: 104 mmol/L (ref 96–106)
Creatinine, Ser: 0.96 mg/dL (ref 0.57–1.00)
Globulin, Total: 2.8 g/dL (ref 1.5–4.5)
Glucose: 76 mg/dL (ref 70–99)
Potassium: 4.5 mmol/L (ref 3.5–5.2)
Sodium: 136 mmol/L (ref 134–144)
Total Protein: 6.7 g/dL (ref 6.0–8.5)
eGFR: 76 mL/min/{1.73_m2} (ref >59)

## 2022-11-11 DIAGNOSIS — Z419 Encounter for procedure for purposes other than remedying health state, unspecified: Secondary | ICD-10-CM | POA: Diagnosis not present

## 2022-12-06 ENCOUNTER — Encounter: Payer: Medicaid Other | Admitting: Family Medicine

## 2022-12-12 DIAGNOSIS — Z419 Encounter for procedure for purposes other than remedying health state, unspecified: Secondary | ICD-10-CM | POA: Diagnosis not present

## 2022-12-22 ENCOUNTER — Encounter: Payer: Self-pay | Admitting: Family Medicine

## 2022-12-22 ENCOUNTER — Other Ambulatory Visit (HOSPITAL_COMMUNITY)
Admission: RE | Admit: 2022-12-22 | Discharge: 2022-12-22 | Disposition: A | Discharge status: Home / Self Care | Payer: Medicaid Other | Source: Ambulatory Visit | Attending: Family Medicine | Admitting: Family Medicine

## 2022-12-22 ENCOUNTER — Ambulatory Visit: Payer: Medicaid Other | Attending: Family Medicine | Admitting: Family Medicine

## 2022-12-22 VITALS — BP 167/102 | HR 82 | Ht 62.0 in | Wt 161.0 lb

## 2022-12-22 DIAGNOSIS — D259 Leiomyoma of uterus, unspecified: Secondary | ICD-10-CM | POA: Diagnosis not present

## 2022-12-22 DIAGNOSIS — Z113 Encounter for screening for infections with a predominantly sexual mode of transmission: Secondary | ICD-10-CM

## 2022-12-22 DIAGNOSIS — Z0001 Encounter for general adult medical examination with abnormal findings: Secondary | ICD-10-CM

## 2022-12-22 DIAGNOSIS — Z124 Encounter for screening for malignant neoplasm of cervix: Secondary | ICD-10-CM | POA: Insufficient documentation

## 2022-12-22 DIAGNOSIS — Z1231 Encounter for screening mammogram for malignant neoplasm of breast: Secondary | ICD-10-CM

## 2022-12-22 DIAGNOSIS — R21 Rash and other nonspecific skin eruption: Secondary | ICD-10-CM

## 2022-12-22 DIAGNOSIS — Z1159 Encounter for screening for other viral diseases: Secondary | ICD-10-CM | POA: Diagnosis not present

## 2022-12-22 DIAGNOSIS — I10 Essential (primary) hypertension: Secondary | ICD-10-CM | POA: Diagnosis not present

## 2022-12-22 MED ORDER — TRIAMCINOLONE ACETONIDE 0.1 % EX CREA: 1.0000 | TOPICAL_CREAM | Freq: Two times a day (BID) | CUTANEOUS | 1 refills | Status: DC

## 2022-12-22 MED ORDER — NICOTINE 14 MG/24HR TD PT24: 14.0000 mg | MEDICATED_PATCH | Freq: Every day | TRANSDERMAL | 1 refills | Status: DC

## 2022-12-22 MED ORDER — AMLODIPINE BESYLATE 10 MG PO TABS: 10.0000 mg | ORAL_TABLET | Freq: Every day | ORAL | 3 refills | Status: DC

## 2022-12-22 NOTE — Progress Notes (Signed)
Subjective:  Patient ID: Tammy Vargas, female    DOB: 1980/12/29  Age: 42 y.o. MRN: [id number]  CC: Gynecologic Exam   HPI Tammy Vargas is a 42 y.o. year old female with a history of hypertension.  Interval History: Discussed the use of AI scribe software for clinical note transcription with the patient, who gave verbal consent to proceed.  The patient presents for an annual check-up. She reports minimal exercise and daily consumption of fruits and vegetables. She has regular appointments with the eye doctor, and dentist. She has a history of smoking a pack a day for the past ten years and has previously tried to quit using Wellbutrin.  Which made her feel sick. She is open to trying patches to aid in quitting.  She is due for mammogram.  She has a history of fibroids and cysts in the adnexa, which were identified in an ultrasound in September of the previous year. She reports feeling a knot or ball on the inside, which she believes has gotten bigger. She has had a biopsy in the past due to these fibroids.       Her blood pressure is elevated despite adherence to her antihypertensive. She is requesting refill of triamcinolone for rash which has been responsive to triamcinolone. Past Medical History:  Diagnosis Date   Hypertension 01/04/2022    No past surgical history on file.  Family History  Problem Relation Age of Onset   Hypertension Mother    Hypertension Father     Social History   Socioeconomic History   Marital status: Single    Spouse name: Not on file   Number of children: Not on file   Years of education: Not on file   Highest education level: Not on file  Occupational History   Not on file  Tobacco Use   Smoking status: Every Day    Current packs/day: 0.22    Types: Cigarettes   Smokeless tobacco: Never  Vaping Use   Vaping status: Never Used  Substance and Sexual Activity   Alcohol use: Yes    Comment: occ   Drug use: Yes    Types: Marijuana    Sexual activity: Not on file  Other Topics Concern   Not on file  Social History Narrative   Not on file   Social Determinants of Health   Financial Resource Strain: Not on file  Food Insecurity: Not on file  Transportation Needs: Not on file  Physical Activity: Not on file  Stress: Not on file  Social Connections: Not on file    No Known Allergies  Outpatient Medications Prior to Visit  Medication Sig Dispense Refill   amLODipine (NORVASC) 5 MG tablet Take 1 tablet (5 mg total) by mouth daily. 90 tablet 1   triamcinolone cream (KENALOG) 0.1 % Apply 1 Application topically 2 (two) times daily. 30 g 1   buPROPion (WELLBUTRIN XL) 150 MG 24 hr tablet Take 1 tablet (150 mg total) by mouth daily. For smoking cessation (Patient not taking: Reported on 10/27/2022) 90 tablet 1   No facility-administered medications prior to visit.     ROS Review of Systems  Constitutional:  Negative for activity change and appetite change.  HENT:  Negative for sinus pressure and sore throat.   Respiratory:  Negative for chest tightness, shortness of breath and wheezing.   Cardiovascular:  Negative for chest pain and palpitations.  Gastrointestinal:  Negative for abdominal distention, abdominal pain and constipation.  Genitourinary: Negative.   Musculoskeletal:  Negative.   Psychiatric/Behavioral:  Negative for behavioral problems and dysphoric mood.     Objective:  BP (!) 167/102   Pulse 82   Ht 5\' 2"  (1.575 m)   Wt 161 lb (73 kg)   SpO2 97%   BMI 29.45 kg/m      12/22/2022   11:18 AM 12/22/2022   10:16 AM 10/27/2022   10:41 AM  BP/Weight  Systolic BP 167 160 149  Diastolic BP 102 108 90  Wt. (Lbs)  161   BMI  29.45 kg/m2       Physical Exam Constitutional:      General: She is not in acute distress.    Appearance: She is well-developed. She is not diaphoretic.  HENT:     Head: Normocephalic.     Right Ear: External ear normal.     Left Ear: External ear normal.     Nose:  Nose normal.     Mouth/Throat:     Mouth: Mucous membranes are moist.  Eyes:     Extraocular Movements: Extraocular movements intact.     Conjunctiva/sclera: Conjunctivae normal.     Pupils: Pupils are equal, round, and reactive to light.  Neck:     Vascular: No JVD.  Cardiovascular:     Rate and Rhythm: Normal rate and regular rhythm.     Pulses: Normal pulses.     Heart sounds: Normal heart sounds. No murmur heard.    No gallop.  Pulmonary:     Effort: Pulmonary effort is normal. No respiratory distress.     Breath sounds: Normal breath sounds. No wheezing or rales.  Chest:     Chest wall: No tenderness.  Abdominal:     General: Bowel sounds are normal. There is no distension.     Palpations: Abdomen is soft. There is no mass.     Tenderness: There is no abdominal tenderness.  Genitourinary:    Comments: External genitalia, vagina normal Unable to visualize cervical os No palpable masses in adnexa Musculoskeletal:        General: No tenderness. Normal range of motion.     Cervical back: Normal range of motion.  Skin:    General: Skin is warm and dry.  Neurological:     Mental Status: She is alert and oriented to person, place, and time.     Deep Tendon Reflexes: Reflexes are normal and symmetric.  Psychiatric:        Mood and Affect: Mood normal.        Latest Ref Rng & Units 10/27/2022   11:13 AM 07/06/2021    6:08 PM  CMP  Glucose 70 - 99 mg/dL 76  83   BUN 6 - 24 mg/dL 9  11   Creatinine 2.53 - 1.00 mg/dL 6.64  4.03   Sodium 474 - 144 mmol/L 136  137   Potassium 3.5 - 5.2 mmol/L 4.5  3.8   Chloride 96 - 106 mmol/L 104  106   CO2 20 - 29 mmol/L 20  24   Calcium 8.7 - 10.2 mg/dL 8.6  9.4   Total Protein 6.0 - 8.5 g/dL 6.7    Total Bilirubin 0.0 - 1.2 mg/dL <2.5    Alkaline Phos 44 - 121 IU/L 98    AST 0 - 40 IU/L 12    ALT 0 - 32 IU/L 13      Lipid Panel  No results found for: "CHOL", "TRIG", "HDL", "CHOLHDL", "VLDL", "LDLCALC", "LDLDIRECT"  CBC  Component Value Date/Time   WBC 10.6 (H) 07/06/2021 1808   RBC 4.48 07/06/2021 1808   HGB 13.1 07/06/2021 1808   HCT 39.9 07/06/2021 1808   PLT 318 07/06/2021 1808   MCV 89.1 07/06/2021 1808   MCH 29.2 07/06/2021 1808   MCHC 32.8 07/06/2021 1808   RDW 13.5 07/06/2021 1808   LYMPHSABS 4.5 (H) 07/06/2021 1808   MONOABS 0.7 07/06/2021 1808   EOSABS 0.2 07/06/2021 1808   BASOSABS 0.0 07/06/2021 1808    No results found for: "HGBA1C"  Assessment & Plan:      Tobacco Use Disorder Patient reports smoking a pack a day for the past 10 years and expresses readiness to quit. Previous trial of Bupropion was not tolerated. -Prescribe nicotine patches to aid in smoking cessation.  Hypertension Elevated blood pressure noted during visit, despite patient reporting adherence to antihypertensive medication. -Repeat blood pressure measurement at end of visit still elevated Amlodipine dose increased to 10 mg and she has been scheduled for nurse visit for BP recheck.  Add chlorthalidone if blood pressure remains above goal -Low-sodium diet  Uterine Fibroids Patient reports feeling a "knot" internally, which may be related to previously diagnosed fibroids. Last ultrasound was in September of the previous year. -Order follow-up ultrasound to assess for changes in fibroids. -If fibroids or cysts are still present, refer to gynecologist for further evaluation.  General Health Maintenance -Encourage patient to aim for 150 minutes of exercise per week. -Continue regular intake of fruits and vegetables. -Order mammogram, as patient is due for annual screening. -Perform Pap smear and STD testing during today's visit. -Refill prescription for patient's topical cream.          Meds ordered this encounter  Medications   nicotine (NICODERM CQ) 14 mg/24hr patch    Sig: Place 1 patch (14 mg total) onto the skin daily.    Dispense:  28 patch    Refill:  1   triamcinolone cream (KENALOG) 0.1 %     Sig: Apply 1 Application topically 2 (two) times daily.    Dispense:  80 g    Refill:  1   amLODipine (NORVASC) 10 MG tablet    Sig: Take 1 tablet (10 mg total) by mouth daily.    Dispense:  30 tablet    Refill:  3    Dose increase    Follow-up: Return in about 2 weeks (around 01/05/2023) for nurse visit, BP check, Medical conditions with PCP, in 3 months.       Hoy Register, MD, FAAFP. [facility] [place], [place] [phone]   12/22/2022, 1:05 PM

## 2022-12-22 NOTE — Patient Instructions (Signed)

## 2022-12-23 ENCOUNTER — Other Ambulatory Visit: Payer: Self-pay | Admitting: Family Medicine

## 2022-12-23 LAB — CERVICOVAGINAL ANCILLARY ONLY
Bacterial Vaginitis (gardnerella): POSITIVE — AB
Candida Glabrata: NEGATIVE
Candida Vaginitis: NEGATIVE
Chlamydia: NEGATIVE
Comment: NEGATIVE
Comment: NEGATIVE
Comment: NEGATIVE
Comment: NEGATIVE
Comment: NEGATIVE
Comment: NORMAL
Neisseria Gonorrhea: NEGATIVE
Trichomonas: NEGATIVE

## 2022-12-23 LAB — LP+NON-HDL CHOLESTEROL
Cholesterol, Total: 231 mg/dL — ABNORMAL HIGH (ref 100–199)
HDL: 64 mg/dL (ref >39)
LDL Chol Calc (NIH): 156 mg/dL — ABNORMAL HIGH (ref 0–99)
Total Non-HDL-Chol (LDL+VLDL): 167 mg/dL — ABNORMAL HIGH (ref 0–129)
Triglycerides: 64 mg/dL (ref 0–149)
VLDL Cholesterol Cal: 11 mg/dL (ref 5–40)

## 2022-12-23 LAB — HIV ANTIBODY (ROUTINE TESTING W REFLEX): HIV Screen 4th Generation wRfx: NONREACTIVE

## 2022-12-23 MED ORDER — ATORVASTATIN CALCIUM 20 MG PO TABS: 20.0000 mg | ORAL_TABLET | Freq: Every day | ORAL | 3 refills | Status: DC

## 2022-12-24 LAB — CYTOLOGY - PAP
Adequacy: ABSENT
Chlamydia: NEGATIVE
Comment: NEGATIVE
Comment: NEGATIVE
Comment: NEGATIVE
Comment: NORMAL
Diagnosis: NEGATIVE
High risk HPV: NEGATIVE
Neisseria Gonorrhea: NEGATIVE
Trichomonas: NEGATIVE

## 2022-12-29 ENCOUNTER — Other Ambulatory Visit: Payer: Medicaid Other

## 2023-01-03 ENCOUNTER — Ambulatory Visit
Admission: RE | Admit: 2023-01-03 | Discharge: 2023-01-03 | Disposition: A | Discharge status: Home / Self Care | Payer: Medicaid Other | Source: Ambulatory Visit | Attending: Family Medicine | Admitting: Family Medicine

## 2023-01-03 DIAGNOSIS — D259 Leiomyoma of uterus, unspecified: Secondary | ICD-10-CM | POA: Diagnosis not present

## 2023-01-03 DIAGNOSIS — N83201 Unspecified ovarian cyst, right side: Secondary | ICD-10-CM | POA: Diagnosis not present

## 2023-01-03 DIAGNOSIS — N83202 Unspecified ovarian cyst, left side: Secondary | ICD-10-CM | POA: Diagnosis not present

## 2023-01-06 ENCOUNTER — Ambulatory Visit: Payer: Medicaid Other | Attending: Family Medicine | Admitting: *Deleted

## 2023-01-06 VITALS — BP 132/90 | HR 74

## 2023-01-06 DIAGNOSIS — I10 Essential (primary) hypertension: Secondary | ICD-10-CM

## 2023-01-06 MED ORDER — CHLORTHALIDONE 25 MG PO TABS: 25.0000 mg | ORAL_TABLET | Freq: Every day | ORAL | 1 refills | Status: DC

## 2023-01-06 NOTE — Progress Notes (Signed)
Blood Pressure Recheck Visit  Name: Tammy Vargas MRN: [id number] Date of Birth: [birth year]  [name] presents today for Blood Pressure recheck with clinical support staff.  Order for BP recheck by Dr. Alvis Lemmings, ordered on 12/22/2022.   BP Readings from Last 3 Encounters:  12/22/22 (!) 167/102  10/27/22 (!) 149/90  01/04/22 (!) 157/99    Current Outpatient Medications  Medication Sig Dispense Refill   amLODipine (NORVASC) 10 MG tablet Take 1 tablet (10 mg total) by mouth daily. 30 tablet 3   atorvastatin (LIPITOR) 20 MG tablet Take 1 tablet (20 mg total) by mouth daily. 30 tablet 3   buPROPion (WELLBUTRIN XL) 150 MG 24 hr tablet Take 1 tablet (150 mg total) by mouth daily. For smoking cessation (Patient not taking: Reported on 10/27/2022) 90 tablet 1   nicotine (NICODERM CQ) 14 mg/24hr patch Place 1 patch (14 mg total) onto the skin daily. 28 patch 1   triamcinolone cream (KENALOG) 0.1 % Apply 1 Application topically 2 (two) times daily. 80 g 1   No current facility-administered medications for this visit.    Hypertensive Medication Review: Patient states that they are taking all their hypertensive medications as prescribed and their last dose of hypertensive medications was this morning   Documentation of any medication adherence discrepancies: none  If blood pressure is above 140/80, add chlorthalidone 25 mg   Patient has been scheduled to follow up with Nurse visit in 2 weeks for BP recheck. October 10 at 9:30 am.   Patient has been given provider's recommendations and does not have any questions or concerns at this time. Patient will contact the office for any future questions or concerns.

## 2023-01-06 NOTE — Patient Instructions (Addendum)
Dr. Alvis Lemmings recommends adding an additional blood pressure medication to your Amlodipine.  She will add  Chlorthalidone 25 mg to be taken daily.  Please return in 2 weeks for nurse visit to recheck your blood pressure.

## 2023-01-11 DIAGNOSIS — Z419 Encounter for procedure for purposes other than remedying health state, unspecified: Secondary | ICD-10-CM | POA: Diagnosis not present

## 2023-01-12 ENCOUNTER — Ambulatory Visit
Admission: RE | Admit: 2023-01-12 | Discharge: 2023-01-12 | Disposition: A | Discharge status: Home / Self Care | Payer: Medicaid Other | Source: Ambulatory Visit | Attending: Family Medicine | Admitting: Family Medicine

## 2023-01-12 DIAGNOSIS — Z1231 Encounter for screening mammogram for malignant neoplasm of breast: Secondary | ICD-10-CM

## 2023-01-14 ENCOUNTER — Other Ambulatory Visit: Payer: Self-pay | Admitting: Family Medicine

## 2023-01-14 DIAGNOSIS — R928 Other abnormal and inconclusive findings on diagnostic imaging of breast: Secondary | ICD-10-CM

## 2023-01-20 ENCOUNTER — Ambulatory Visit: Payer: Medicaid Other | Attending: Family Medicine

## 2023-01-20 DIAGNOSIS — I10 Essential (primary) hypertension: Secondary | ICD-10-CM | POA: Diagnosis not present

## 2023-01-20 NOTE — Progress Notes (Signed)
   Blood Pressure Recheck Visit  Name: Tammy Vargas MRN: [id number] Date of Birth: 1980/10/08  Tonga Prout presents today for Blood Pressure recheck with clinical support staff.  Order for BP recheck by Bunnie Philips ordered on 01/20/2023.   BP Readings from Last 3 Encounters:  01/20/23 135/86  01/06/23 (!) 132/90  12/22/22 (!) 167/102    Current Outpatient Medications  Medication Sig Dispense Refill   amLODipine (NORVASC) 10 MG tablet Take 1 tablet (10 mg total) by mouth daily. 30 tablet 3   atorvastatin (LIPITOR) 20 MG tablet Take 1 tablet (20 mg total) by mouth daily. 30 tablet 3   chlorthalidone (HYGROTON) 25 MG tablet Take 1 tablet (25 mg total) by mouth daily. 90 tablet 1   nicotine (NICODERM CQ) 14 mg/24hr patch Place 1 patch (14 mg total) onto the skin daily. 28 patch 1   triamcinolone cream (KENALOG) 0.1 % Apply 1 Application topically 2 (two) times daily. 80 g 1   No current facility-administered medications for this visit.    Hypertensive Medication Review: Patient states that they are taking all their hypertensive medications as prescribed and their last dose of hypertensive medications was this morning   Documentation of any medication adherence discrepancies: none  Provider Recommendation:  Spoke to Dr.Newlin and they stated: No changes in BP medications  Patient has been scheduled to follow up with PCP as scheduled   Patient has been given provider's recommendations and does not have any questions or concerns at this time. Patient will contact the office for any future questions or concerns.

## 2023-01-24 ENCOUNTER — Other Ambulatory Visit: Payer: Self-pay | Admitting: Family Medicine

## 2023-01-24 ENCOUNTER — Telehealth: Payer: Self-pay

## 2023-01-24 DIAGNOSIS — N949 Unspecified condition associated with female genital organs and menstrual cycle: Secondary | ICD-10-CM

## 2023-01-24 DIAGNOSIS — D259 Leiomyoma of uterus, unspecified: Secondary | ICD-10-CM

## 2023-01-24 NOTE — Telephone Encounter (Signed)
FYI, imaging is in epic.

## 2023-01-24 NOTE — Telephone Encounter (Signed)
I have already spoken to the patient about this and placed referral.

## 2023-01-24 NOTE — Telephone Encounter (Signed)
Copied from CRM [id number]. Topic: General - Inquiry >> Jan 24, 2023 11:40 AM Patsy Lager T wrote: Reason for CRM: Diane from [facility] called to give a real time call report. Please f/u

## 2023-02-01 ENCOUNTER — Ambulatory Visit
Admission: RE | Admit: 2023-02-01 | Discharge: 2023-02-01 | Disposition: A | Discharge status: Home / Self Care | Payer: Medicaid Other | Source: Ambulatory Visit | Attending: Family Medicine | Admitting: Family Medicine

## 2023-02-01 ENCOUNTER — Other Ambulatory Visit: Payer: Self-pay | Admitting: Family Medicine

## 2023-02-01 DIAGNOSIS — R928 Other abnormal and inconclusive findings on diagnostic imaging of breast: Secondary | ICD-10-CM

## 2023-02-01 DIAGNOSIS — N6310 Unspecified lump in the right breast, unspecified quadrant: Secondary | ICD-10-CM

## 2023-02-01 DIAGNOSIS — N6321 Unspecified lump in the left breast, upper outer quadrant: Secondary | ICD-10-CM | POA: Diagnosis not present

## 2023-02-01 DIAGNOSIS — N6325 Unspecified lump in the left breast, overlapping quadrants: Secondary | ICD-10-CM | POA: Diagnosis not present

## 2023-02-01 DIAGNOSIS — N6315 Unspecified lump in the right breast, overlapping quadrants: Secondary | ICD-10-CM | POA: Diagnosis not present

## 2023-02-01 DIAGNOSIS — N6320 Unspecified lump in the left breast, unspecified quadrant: Secondary | ICD-10-CM

## 2023-02-02 ENCOUNTER — Encounter: Payer: Self-pay | Admitting: Family Medicine

## 2023-02-11 DIAGNOSIS — Z419 Encounter for procedure for purposes other than remedying health state, unspecified: Secondary | ICD-10-CM | POA: Diagnosis not present

## 2023-02-28 IMAGING — CR DG FINGER INDEX 2+V*L*
3 series · 3 of 3 positions shown · non-contrast
Comparison: None

CLINICAL DATA: Infection LEFT index finger, laceration 1 month ago,
sutured, has not healed well and is now red and swollen

EXAM:
LEFT INDEX FINGER 2+V

[finger ap]
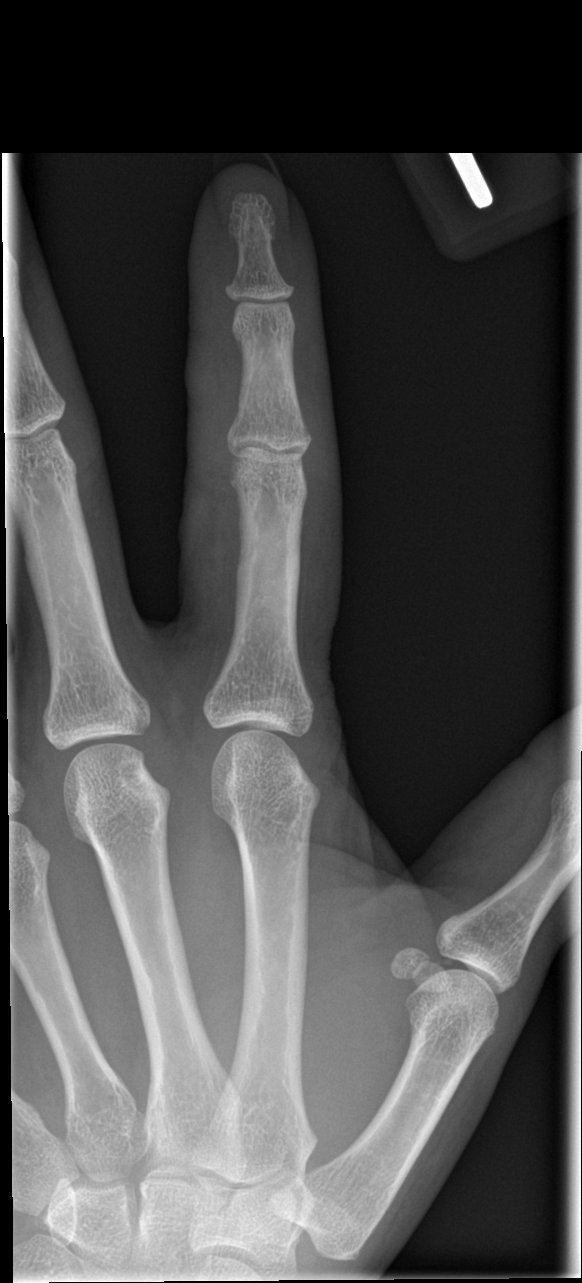

[finger obl]
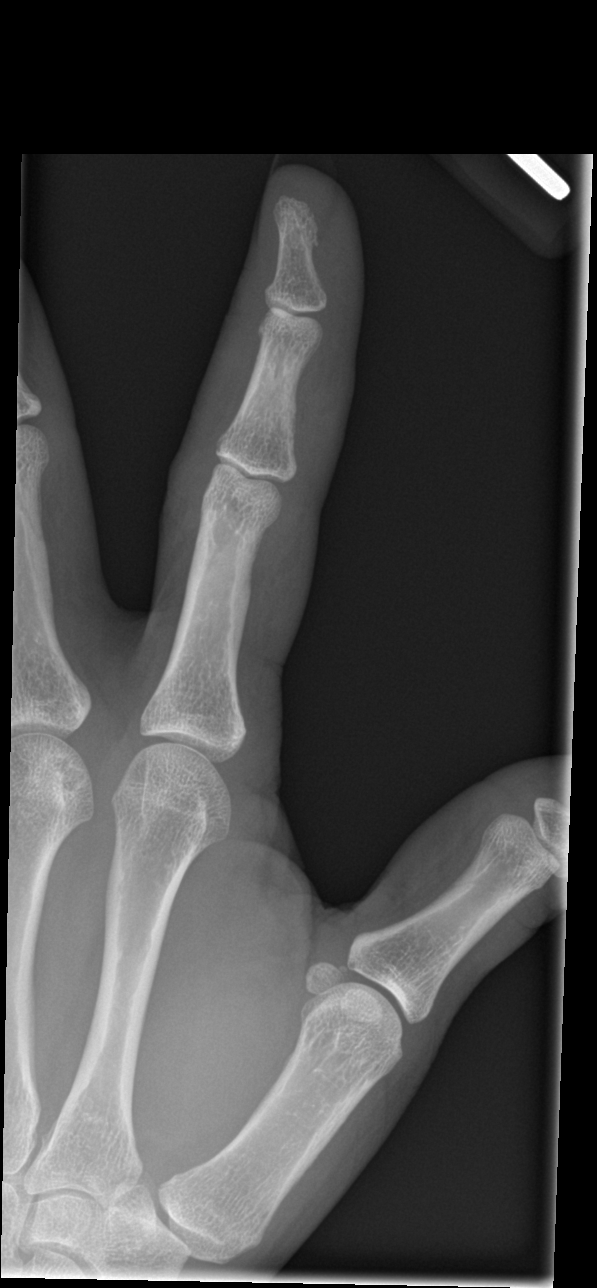

[finger lat]
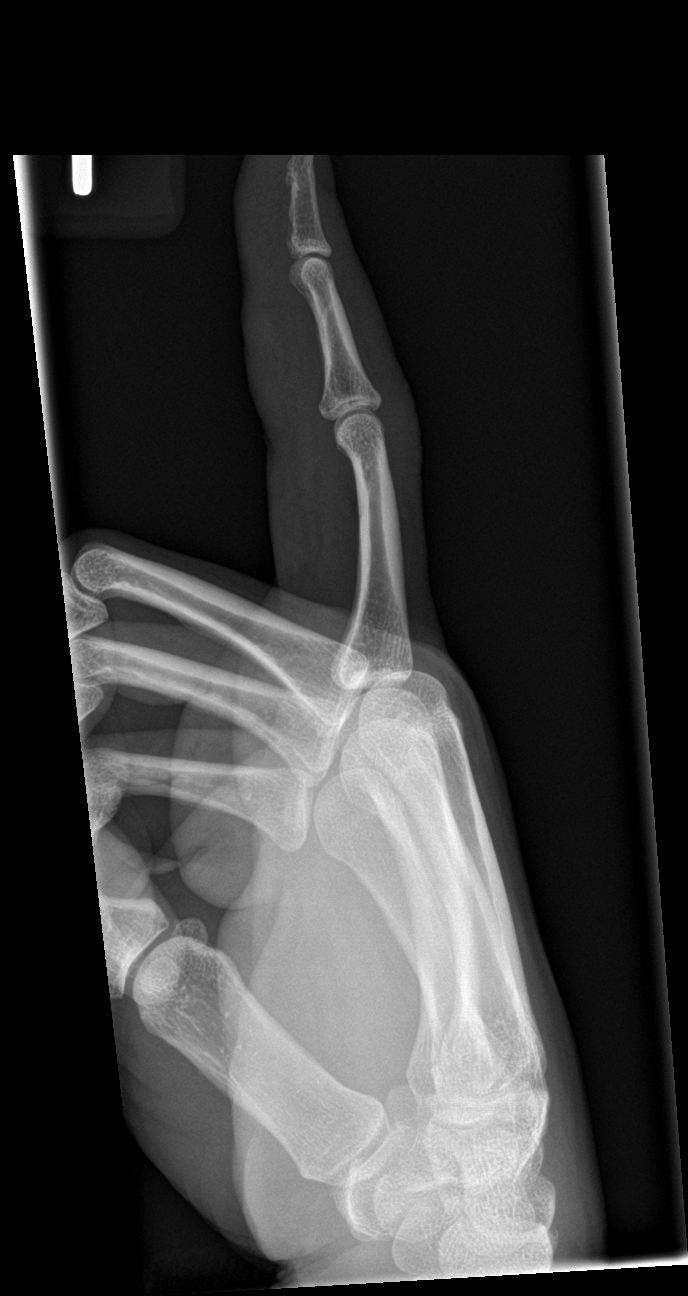

[3 of 3 positions shown; findings below may reference images not displayed]

FINDINGS: Diffuse soft tissue swelling LEFT index finger.

Osseous mineralization normal.

Joint spaces preserved.

No acute fracture, dislocation, or bone destruction.

No radiopaque foreign bodies or soft tissue gas.
IMPRESSION: Soft tissue swelling without osseous abnormalities.

## 2023-03-13 DIAGNOSIS — Z419 Encounter for procedure for purposes other than remedying health state, unspecified: Secondary | ICD-10-CM | POA: Diagnosis not present

## 2023-03-28 ENCOUNTER — Encounter: Payer: Medicaid Other | Admitting: Obstetrics and Gynecology

## 2023-03-28 ENCOUNTER — Other Ambulatory Visit: Payer: Self-pay

## 2023-03-30 ENCOUNTER — Ambulatory Visit: Payer: Self-pay | Admitting: Family Medicine

## 2023-04-13 DIAGNOSIS — Z419 Encounter for procedure for purposes other than remedying health state, unspecified: Secondary | ICD-10-CM | POA: Diagnosis not present

## 2023-05-04 NOTE — Progress Notes (Unsigned)
NEW GYNECOLOGY PATIENT Patient name: Tammy Vargas MRN [id number]  Date of birth: 08-01-1980 Chief Complaint:   No chief complaint on file.     History:  Tammy Vargas is a 43 y.o. No obstetric history on file. being seen today for ***.         Gynecologic History No LMP recorded. Contraception: {method:5051} Last Pap:     Component Value Date/Time   DIAGPAP  12/22/2022 1101    - Negative for intraepithelial lesion or malignancy (NILM)   HPVHIGH Negative 12/22/2022 1101   ADEQPAP  12/22/2022 1101    Satisfactory for evaluation; transformation zone component ABSENT.    High Risk HPV: Positive  Adequacy:  Satisfactory for evaluation, transformation zone component PRESENT  Diagnosis:  Atypical squamous cells of undetermined significance (ASC-US)  Last Mammogram: {NA AND XBMWUXLK:44010} Last Colonoscopy: {NA AND UVOZDGUY:40347}  Obstetric History OB History  No obstetric history on file.    Past Medical History:  Diagnosis Date   Hypertension 01/04/2022    No past surgical history on file.  Current Outpatient Medications on File Prior to Visit  Medication Sig Dispense Refill   amLODipine (NORVASC) 10 MG tablet Take 1 tablet (10 mg total) by mouth daily. 30 tablet 3   atorvastatin (LIPITOR) 20 MG tablet Take 1 tablet (20 mg total) by mouth daily. 30 tablet 3   chlorthalidone (HYGROTON) 25 MG tablet Take 1 tablet (25 mg total) by mouth daily. 90 tablet 1   nicotine (NICODERM CQ) 14 mg/24hr patch Place 1 patch (14 mg total) onto the skin daily. 28 patch 1   triamcinolone cream (KENALOG) 0.1 % Apply 1 Application topically 2 (two) times daily. 80 g 1   No current facility-administered medications on file prior to visit.    No Known Allergies  Social History:  reports that she has been smoking cigarettes. She has never used smokeless tobacco. She reports current alcohol use. She reports current drug use. Drug: Marijuana.  Family History  Problem Relation Age of  Onset   Hypertension Mother    Hypertension Father     The following portions of the patient's history were reviewed and updated as appropriate: allergies, current medications, past family history, past medical history, past social history, past surgical history and problem list.  Review of Systems Pertinent items noted in HPI and remainder of comprehensive ROS otherwise negative.  Physical Exam:  There were no vitals taken for this visit. Physical Exam  CLINICAL DATA:  Uterine fibroid and adnexal cyst.   EXAM: TRANSABDOMINAL AND TRANSVAGINAL ULTRASOUND OF PELVIS   TECHNIQUE: Both transabdominal and transvaginal ultrasound examinations of the pelvis were performed. Transabdominal technique was performed for global imaging of the pelvis including uterus, ovaries, adnexal regions, and pelvic cul-de-sac. It was necessary to proceed with endovaginal exam following the transabdominal exam to visualize the uterus ovaries and adnexa.   COMPARISON:  Limited pelvic ultrasound 01/05/2022   FINDINGS: Uterus   Measurements: 12.4 x 6.5 x 6.7 cm = volume: 281 mL. The uterus is anteverted and contains multiple fibroids. Largest fibroids are as follows: 3.1 x 2.3 x 2.4 cm superior anteriorly in the fundus, subserosal. 3 x 2.3 x 2.7 cm left anterior uterine body, subserosal. 2.4 x 2 x 2.3 cm, intramural.   Endometrium   Thickness: 6-7 mm, normal.  No focal abnormality visualized.   Right ovary   Normal ovarian tissue is not demonstrated.   Left ovary   Normal ovarian tissue is not demonstrated.   Other findings  Left adnexal cystic structure measuring 12.6 x 7 x 12.1 cm. This appears to be complex with possible internal septation. Posteriorly in the midline/right adnexa is a 10.2 x 8.5 x 9.3 cm cystic structure. This may have low level internal echoes. No convincing free fluid.   IMPRESSION: 1. Large cystic structures in the pelvis and adnexa, greater than 10 cm. On the  left this has a septation, and on the right low level internal echoes suspected. Etiology is indeterminate, and ovarian tissue could not be delineated by ultrasound. Given size, neoplasm is considered, but not characterized by ultrasound. Recommend further characterization with pelvic MRI. 2. Enlarged uterus with multiple fibroids.   These results will be called to the ordering clinician or representative by the Radiologist Assistant, and communication documented in the PACS or [employer].     Electronically Signed   By: Narda Rutherford M.D.   On: 01/23/2023 10:28     Assessment and Plan:   There are no diagnoses linked to this encounter.   Routine preventative health maintenance measures emphasized. Please refer to After Visit Summary for other counseling recommendations.   Follow-up: No follow-ups on file.      Lorriane Shire, MD Obstetrician & Gynecologist, [facility] [employer], [facility]

## 2023-05-05 ENCOUNTER — Other Ambulatory Visit: Payer: Self-pay

## 2023-05-05 ENCOUNTER — Encounter: Payer: Self-pay | Admitting: Obstetrics and Gynecology

## 2023-05-05 ENCOUNTER — Ambulatory Visit: Payer: Medicaid Other | Admitting: Obstetrics and Gynecology

## 2023-05-05 VITALS — BP 141/92 | HR 70 | Wt 167.0 lb

## 2023-05-05 DIAGNOSIS — F17200 Nicotine dependence, unspecified, uncomplicated: Secondary | ICD-10-CM

## 2023-05-05 DIAGNOSIS — R102 Pelvic and perineal pain: Secondary | ICD-10-CM

## 2023-05-05 DIAGNOSIS — N9489 Other specified conditions associated with female genital organs and menstrual cycle: Secondary | ICD-10-CM

## 2023-05-05 DIAGNOSIS — G8929 Other chronic pain: Secondary | ICD-10-CM

## 2023-05-06 LAB — CBC
Hematocrit: 40.6 % (ref 34.0–46.6)
Hemoglobin: 13.2 g/dL (ref 11.1–15.9)
MCH: 28.9 pg (ref 26.6–33.0)
MCHC: 32.5 g/dL (ref 31.5–35.7)
MCV: 89 fL (ref 79–97)
Platelets: 271 10*3/uL (ref 150–450)
RBC: 4.57 x10E6/uL (ref 3.77–5.28)
RDW: 12.4 % (ref 11.7–15.4)
WBC: 7 10*3/uL (ref 3.4–10.8)

## 2023-05-06 LAB — OVARIAN MALIGNANCY RISK-ROMA
Cancer Antigen (CA) 125: 17 U/mL (ref 0.0–38.1)
HE4: 88.8 pmol/L — ABNORMAL HIGH (ref 0.0–63.6)
Postmenopausal ROMA: 2.06
Premenopausal ROMA: 2.41 — ABNORMAL HIGH

## 2023-05-06 LAB — BASIC METABOLIC PANEL
BUN/Creatinine Ratio: 9 (ref 9–23)
BUN: 7 mg/dL (ref 6–24)
CO2: 19 mmol/L — ABNORMAL LOW (ref 20–29)
Calcium: 8.8 mg/dL (ref 8.7–10.2)
Chloride: 102 mmol/L (ref 96–106)
Creatinine, Ser: 0.82 mg/dL (ref 0.57–1.00)
Glucose: 95 mg/dL (ref 70–99)
Potassium: 5.1 mmol/L (ref 3.5–5.2)
Sodium: 137 mmol/L (ref 134–144)
eGFR: 92 mL/min/{1.73_m2} (ref >59)

## 2023-05-06 LAB — PREMENOPAUSAL INTERP: HIGH

## 2023-05-06 LAB — POSTMENOPAUSAL INTERP: LOW

## 2023-05-09 ENCOUNTER — Ambulatory Visit (HOSPITAL_COMMUNITY)
Admission: RE | Admit: 2023-05-09 | Discharge: 2023-05-09 | Disposition: A | Discharge status: Home / Self Care | Payer: Medicaid Other | Source: Ambulatory Visit | Attending: Obstetrics and Gynecology | Admitting: Obstetrics and Gynecology

## 2023-05-09 DIAGNOSIS — N9489 Other specified conditions associated with female genital organs and menstrual cycle: Secondary | ICD-10-CM | POA: Diagnosis not present

## 2023-05-09 DIAGNOSIS — D259 Leiomyoma of uterus, unspecified: Secondary | ICD-10-CM | POA: Diagnosis not present

## 2023-05-09 MED ORDER — GADOBUTROL 1 MMOL/ML IV SOLN
6.8000 mL | Freq: Once | INTRAVENOUS | Status: AC | PRN
  Administered 2023-05-09: 6.8 mL via INTRAVENOUS

## 2023-05-10 ENCOUNTER — Telehealth: Payer: Self-pay | Admitting: Obstetrics and Gynecology

## 2023-05-10 DIAGNOSIS — N9489 Other specified conditions associated with female genital organs and menstrual cycle: Secondary | ICD-10-CM

## 2023-05-10 DIAGNOSIS — R978 Other abnormal tumor markers: Secondary | ICD-10-CM

## 2023-05-10 NOTE — Telephone Encounter (Signed)
Called patient and confirmed ID x2. Noted that ROMA is positive increasing concern for malignancy. Pelvic MRI pending. Will place referral to gyn oncology for further discussion

## 2023-05-12 ENCOUNTER — Telehealth: Payer: Self-pay

## 2023-05-12 NOTE — Telephone Encounter (Signed)
Spoke with Tammy Vargas regarding her referral to GYN oncology. She has an appointment scheduled with Dr. Alvester Morin on 05/30/23 at 10:30. Patient agrees to date and time. She has been provided with office address and location. She is also aware of our mask and visitor policy. Patient verbalized understanding and will call with any questions.

## 2023-05-14 DIAGNOSIS — Z419 Encounter for procedure for purposes other than remedying health state, unspecified: Secondary | ICD-10-CM | POA: Diagnosis not present

## 2023-05-18 ENCOUNTER — Telehealth: Payer: Self-pay | Admitting: Obstetrics and Gynecology

## 2023-05-18 NOTE — Telephone Encounter (Signed)
 Called patient and confirmed ID x2. 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 Patient has follow up with gyn onc scheduled. Noted that only way to confirm if malignant is with surgical removal. All questions answered.

## 2023-05-25 ENCOUNTER — Encounter: Payer: Self-pay | Admitting: Psychiatry

## 2023-05-26 DIAGNOSIS — H5213 Myopia, bilateral: Secondary | ICD-10-CM | POA: Diagnosis not present

## 2023-05-30 ENCOUNTER — Inpatient Hospital Stay (HOSPITAL_BASED_OUTPATIENT_CLINIC_OR_DEPARTMENT_OTHER): Payer: Medicaid Other | Admitting: Psychiatry

## 2023-05-30 ENCOUNTER — Inpatient Hospital Stay (HOSPITAL_BASED_OUTPATIENT_CLINIC_OR_DEPARTMENT_OTHER): Payer: Medicaid Other | Admitting: Gynecologic Oncology

## 2023-05-30 ENCOUNTER — Encounter: Payer: Self-pay | Admitting: Psychiatry

## 2023-05-30 ENCOUNTER — Inpatient Hospital Stay: Payer: Medicaid Other | Attending: Psychiatry

## 2023-05-30 ENCOUNTER — Inpatient Hospital Stay: Payer: Medicaid Other | Admitting: Licensed Clinical Social Worker

## 2023-05-30 VITALS — BP 150/90 | HR 83 | Temp 98.9°F | Resp 18 | Ht 62.0 in | Wt 172.0 lb

## 2023-05-30 DIAGNOSIS — R6881 Early satiety: Secondary | ICD-10-CM | POA: Insufficient documentation

## 2023-05-30 DIAGNOSIS — Z79899 Other long term (current) drug therapy: Secondary | ICD-10-CM | POA: Insufficient documentation

## 2023-05-30 DIAGNOSIS — N920 Excessive and frequent menstruation with regular cycle: Secondary | ICD-10-CM | POA: Insufficient documentation

## 2023-05-30 DIAGNOSIS — D398 Neoplasm of uncertain behavior of other specified female genital organs: Secondary | ICD-10-CM | POA: Diagnosis not present

## 2023-05-30 DIAGNOSIS — F17210 Nicotine dependence, cigarettes, uncomplicated: Secondary | ICD-10-CM | POA: Diagnosis not present

## 2023-05-30 DIAGNOSIS — D259 Leiomyoma of uterus, unspecified: Secondary | ICD-10-CM | POA: Insufficient documentation

## 2023-05-30 DIAGNOSIS — R978 Other abnormal tumor markers: Secondary | ICD-10-CM

## 2023-05-30 DIAGNOSIS — E785 Hyperlipidemia, unspecified: Secondary | ICD-10-CM | POA: Diagnosis not present

## 2023-05-30 DIAGNOSIS — I10 Essential (primary) hypertension: Secondary | ICD-10-CM | POA: Insufficient documentation

## 2023-05-30 DIAGNOSIS — N9489 Other specified conditions associated with female genital organs and menstrual cycle: Secondary | ICD-10-CM

## 2023-05-30 DIAGNOSIS — R102 Pelvic and perineal pain: Secondary | ICD-10-CM | POA: Insufficient documentation

## 2023-05-30 DIAGNOSIS — Z1211 Encounter for screening for malignant neoplasm of colon: Secondary | ICD-10-CM | POA: Insufficient documentation

## 2023-05-30 DIAGNOSIS — Z803 Family history of malignant neoplasm of breast: Secondary | ICD-10-CM | POA: Insufficient documentation

## 2023-05-30 DIAGNOSIS — Z800 Family history of malignant neoplasm of digestive organs: Secondary | ICD-10-CM | POA: Insufficient documentation

## 2023-05-30 LAB — CEA (ACCESS): CEA (CHCC): 3.17 ng/mL (ref 0.00–5.00)

## 2023-05-30 NOTE — Patient Instructions (Signed)
 Preparing for your Surgery  Plan for surgery on July 05, 2023 with Dr. Clide Cliff at [facility]. You will be scheduled for a robotic assisted total laparoscopic hysterectomy (removal of the uterus and cervix), bilateral salpingectomies (removal of both fallopian tubes), possible bilateral oophorectomies (removal of both ovaries), possible staging, possible laparotomy.   Pre-operative Testing -You will receive a phone call from presurgical testing at [facility] OR to arrange for a pre-operative appointment and lab work.  -Bring your insurance card, copy of an advanced directive if applicable, medication list  -At that visit, you will be asked to sign a consent for a possible blood transfusion in case a transfusion becomes necessary during surgery.  The need for a blood transfusion is rare but having consent is a necessary part of your care.     -You should not be taking blood thinners or aspirin at least ten days prior to surgery unless instructed by your surgeon.  -Do not take supplements such as fish oil (omega 3), red yeast rice, turmeric before your surgery. STOP TAKING AT LEAST 10 DAYS BEFORE SURGERY. You want to avoid medications with aspirin in them including headache powders such as BC or Goody's), Excedrin migraine.  Day Before Surgery at Home -You will be asked to take in a light diet the day before surgery. You will be advised you can have clear liquids up until 3 hours before your surgery.    Eat a light diet the day before surgery.  Examples including soups, broths, toast, yogurt, mashed potatoes.  AVOID GAS PRODUCING FOODS AND BEVERAGES. Things to avoid include carbonated beverages (fizzy beverages, sodas), raw fruits and raw vegetables (uncooked), or beans.   If your bowels are filled with gas, your surgeon will have difficulty visualizing your pelvic organs which increases your surgical risks.  Your role in recovery Your role is to become active as soon as  directed by your doctor, while still giving yourself time to heal.  Rest when you feel tired. You will be asked to do the following in order to speed your recovery:  - Cough and breathe deeply. This helps to clear and expand your lungs and can prevent pneumonia after surgery.  - STAY ACTIVE WHEN YOU GET HOME. Do mild physical activity. Walking or moving your legs help your circulation and body functions return to normal. Do not try to get up or walk alone the first time after surgery.   -If you develop swelling on one leg or the other, pain in the back of your leg, redness/warmth in one of your legs, please call the office or go to the Emergency Room to have a doppler to rule out a blood clot. For shortness of breath, chest pain-seek care in the Emergency Room as soon as possible. - Actively manage your pain. Managing your pain lets you move in comfort. We will ask you to rate your pain on a scale of zero to 10. It is your responsibility to tell your doctor or nurse where and how much you hurt so your pain can be treated.  Special Considerations -If you are diabetic, you may be placed on insulin after surgery to have closer control over your blood sugars to promote healing and recovery.  This does not mean that you will be discharged on insulin.  If applicable, your oral antidiabetics will be resumed when you are tolerating a solid diet.  -Your final pathology results from surgery should be available around one week after surgery  and the results will be relayed to you when available.  -Dr. Antionette Char is the surgeon that assists your GYN Oncologist with surgery.  If you end up staying the night, the next day after your surgery you will either see Dr. Pricilla Holm, Dr. Alvester Morin, or Dr. Antionette Char.  -FMLA forms can be faxed to [phone] and please allow 5-7 business days for completion.  Pain Management After Surgery -You will be prescribed your pain medication and bowel regimen medications  before surgery so that you can have these available when you are discharged from the hospital. The pain medication is for use ONLY AFTER surgery and a new prescription will not be given.   -Make sure that you have Tylenol and Ibuprofen IF YOU ARE ABLE TO TAKE THESE MEDICATIONS at home to use on a regular basis after surgery for pain control. We recommend alternating the medications every hour to six hours since they work differently and are processed in the body differently for pain relief.  -Review the attached handout on narcotic use and their risks and side effects.   Bowel Regimen -You will be prescribed Sennakot-S to take nightly to prevent constipation especially if you are taking the narcotic pain medication intermittently.  It is important to prevent constipation and drink adequate amounts of liquids. You can stop taking this medication when you are not taking pain medication and you are back on your normal bowel routine.  Risks of Surgery Risks of surgery are low but include bleeding, infection, damage to surrounding structures, re-operation, blood clots, and very rarely death.   Blood Transfusion Information (For the consent to be signed before surgery)  We will be checking your blood type before surgery so in case of emergencies, we will know what type of blood you would need.                                            WHAT IS A BLOOD TRANSFUSION?  A transfusion is the replacement of blood or some of its parts. Blood is made up of multiple cells which provide different functions. Red blood cells carry oxygen and are used for blood loss replacement. White blood cells fight against infection. Platelets control bleeding. Plasma helps clot blood. Other blood products are available for specialized needs, such as hemophilia or other clotting disorders. BEFORE THE TRANSFUSION  Who gives blood for transfusions?  You may be able to donate blood to be used at a later date on yourself  (autologous donation). Relatives can be asked to donate blood. This is generally not any safer than if you have received blood from a stranger. The same precautions are taken to ensure safety when a relative's blood is donated. Healthy volunteers who are fully evaluated to make sure their blood is safe. This is blood bank blood. Transfusion therapy is the safest it has ever been in the practice of medicine. Before blood is taken from a donor, a complete history is taken to make sure that person has no history of diseases nor engages in risky social behavior (examples are intravenous drug use or sexual activity with multiple partners). The donor's travel history is screened to minimize risk of transmitting infections, such as malaria. The donated blood is tested for signs of infectious diseases, such as HIV and hepatitis. The blood is then tested to be sure it is compatible with you in order  to minimize the chance of a transfusion reaction. If you or a relative donates blood, this is often done in anticipation of surgery and is not appropriate for emergency situations. It takes many days to process the donated blood. RISKS AND COMPLICATIONS Although transfusion therapy is very safe and saves many lives, the main dangers of transfusion include:  Getting an infectious disease. Developing a transfusion reaction. This is an allergic reaction to something in the blood you were given. Every precaution is taken to prevent this. The decision to have a blood transfusion has been considered carefully by your caregiver before blood is given. Blood is not given unless the benefits outweigh the risks.  AFTER SURGERY INSTRUCTIONS  Return to work: 4-6 weeks if applicable  Activity: 1. Be up and out of the bed during the day.  Take a nap if needed.  You may walk up steps but be careful and use the hand rail.  Stair climbing will tire you more than you think, you may need to stop part way and rest.   2. No lifting or  straining for 6 weeks over 10 pounds. No pushing, pulling, straining for 6 weeks.  3. No driving for 4-54 days when the following criteria have been met: Do not drive if you are taking narcotic pain medicine and make sure that your reaction time has returned.   4. You can shower as soon as the next day after surgery. Shower daily.  Use your regular soap and water (not directly on the incision) and pat your incision(s) dry afterwards; don't rub.  No tub baths or submerging your body in water until cleared by your surgeon. If you have the soap that was given to you by pre-surgical testing that was used before surgery, you do not need to use it afterwards because this can irritate your incisions.   5. No sexual activity and nothing in the vagina for 12 weeks.  6. You may experience a small amount of clear drainage from your incisions, which is normal.  If the drainage persists, increases, or changes color please call the office.  7. Do not use creams, lotions, or ointments such as neosporin on your incisions after surgery until advised by your surgeon because they can cause removal of the dermabond glue on your incisions.    8. You may experience vaginal spotting after surgery or when the stitches at the top of the vagina begin to dissolve.  The spotting is normal but if you experience heavy bleeding, call our office.  9. Take Tylenol or ibuprofen first for pain if you are able to take these medications and only use narcotic pain medication for severe pain not relieved by the Tylenol or Ibuprofen.  Monitor your Tylenol intake to a max of 4,000 mg in a 24 hour period. You can alternate these medications after surgery.  Diet: 1. Low sodium Heart Healthy Diet is recommended but you are cleared to resume your normal (before surgery) diet after your procedure.  2. It is safe to use a laxative, such as Miralax or Colace, if you have difficulty moving your bowels before surgery. You have been prescribed  Sennakot-S to take at bedtime every evening after surgery to keep bowel movements regular and to prevent constipation.    Wound Care: 1. Keep clean and dry.  Shower daily.  Reasons to call the Doctor: Fever - Oral temperature greater than 100.4 degrees Fahrenheit Foul-smelling vaginal discharge Difficulty urinating Nausea and vomiting Increased pain at the site of  the incision that is unrelieved with pain medicine. Difficulty breathing with or without chest pain New calf pain especially if only on one side Sudden, continuing increased vaginal bleeding with or without clots.   Contacts: For questions or concerns you should contact:  Dr. Clide Cliff at [phone]  Warner Mccreedy, NP at [phone]  After Hours: call [phone] and have the GYN Oncologist paged/contacted (after 5 pm or on the weekends). You will speak with an after hours RN and let he or she know you have had surgery.  Messages sent via mychart are for non-urgent matters and are not responded to after hours so for urgent needs, please call the after hours number.

## 2023-05-30 NOTE — Progress Notes (Signed)
 Patient here for a pre-operative appointment prior to her scheduled surgery on July 05, 2023. She is scheduled for a robotic assisted total laparoscopic hysterectomy, bilateral salpingectomies, possible bilateral oophorectomies, possible staging, possible laparotomy. The surgery was discussed in detail.  See after visit summary for additional details.    Discussed post-op pain management in detail including the aspects of the enhanced recovery pathway.  Advised her that a new prescription would be sent in and it is only to be used for after her upcoming surgery.  We discussed the use of tylenol post-op and to monitor for a maximum of 4,000 mg in a 24 hour period.  Also prescribed sennakot to be used after surgery and to hold if having loose stools.  Discussed bowel regimen in detail.     Discussed the use of SCDs and measures to take at home to prevent DVT including frequent mobility.  Reportable signs and symptoms of DVT discussed. Post-operative instructions discussed and expectations for after surgery. Incisional care discussed as well including reportable signs and symptoms including erythema, drainage, wound separation.     30 minutes spent with the patient.  Verbalizing understanding of material discussed. No needs or concerns voiced at the end of the visit.   Advised patient to call for any needs.  Advised that her post-operative medications had been prescribed and could be picked up at any time.    This appointment is included in the global surgical bundle as pre-operative teaching and has no charge.

## 2023-05-30 NOTE — Progress Notes (Signed)
 [facility] Clinical Social Work  Initial Assessment   Tammy Vargas is a 43 y.o. year old female presenting alone. Clinical Social Work was referred by nurse for assessment of psychosocial needs.   SDOH (Social Determinants of Health) assessments performed: Yes SDOH Interventions    Flowsheet Row Clinical Support from 05/30/2023 in [facility] - A Dept Of [place]. [facility] Most recent reading at 05/30/2023  1:15 PM Office Visit from 05/30/2023 in [facility] - A Dept Of [place]. [facility] Most recent reading at 05/25/2023  9:12 AM  SDOH Interventions    Food Insecurity Interventions Other (Comment), Community Resources Provided  [facility] food pantry] --  Housing Interventions Community Resources Provided --  Utilities Interventions Community Resources Provided AMB Referral       SDOH Screenings   Food Insecurity: Food Insecurity Present (05/30/2023)  Housing: High Risk (05/25/2023)  Transportation Needs: No Transportation Needs (05/05/2023)  Utilities: At Risk (05/25/2023)  Depression (PHQ2-9): Medium Risk (05/05/2023)  Tobacco Use: High Risk (01/06/2023)     Distress Screen completed: No     No data to display            Family/Social Information:  Housing Arrangement: patient lives with her 54 yo daughter. Daughter is disabled and receives SSI- pt is her legal guardian Transportation concerns: no  Employment: Working full time for [employer].  Income source: Employment and daughter's SSI Financial concerns: Yes, current concerns Type of concern: Utilities and Food Food access concerns: yes, receives $23 in food stamps Religious or spiritual practice: Not known Advanced directives: Not known Services Currently in place:  SNAP/food stamps, Section 8, L[employer] for electric assistance  Coping/ Adjustment to diagnosis: Patient understands treatment plan and what happens next? yes, has surgery in March Patient reported stressors:  Finances Current coping skills/ strengths: Other: able to access community resources    SUMMARY: Current SDOH Barriers:  Financial constraints related to food and utilities  Clinical Social Work Clinical Goal(s):  Scientist, research (life sciences) options for unmet needs related to:  Corporate treasurer  and Food Insecurity   Interventions: Provided CSW contact information and encouraged patient to call with any questions or concerns Provided patient with information about local resources to assist with utilities and food ([place] [employer], Holiday representative, Stage manager)   Follow Up Plan: Patient will follow-up with community resources for assistance Patient verbalizes understanding of plan: Yes    Tammy Bordas E Lyvonne Cassell, LCSW Clinical Social Worker [facility]  Patient is participating in a Managed Medicaid Plan:  Yes

## 2023-05-30 NOTE — Progress Notes (Signed)
 GYNECOLOGIC ONCOLOGY NEW PATIENT CONSULTATION  Date of Service: 05/30/2023 Referring Provider: Lorriane Shire, MD   ASSESSMENT AND PLAN: Tammy Vargas is a 43 y.o. woman with bilateral cystic adnexal lesions, MRI suspicious for bilateral hydrosalpinx.  Normal CA125 but elevated HE4/premenopausal ROMA.  Additionally with uterine fibroids, pelvic pain, and heavy menstrual bleeding.  We reviewed that the exact etiology of the pelvic mass is unclear, but could include a benign, borderline, or malignant process.  The recommended treatment is surgical excision to make a definitive diagnosis. At this time, based on MRI, normal CA125, we overall suspect a benign process, but rome residual risk remains given elevated ROMA.  Additionally, pt with pelvic pain and AUB with heavy menstrual bleeding. Discussed that although pelvic pain is likely related to the cystic masses, fibroids can sometimes contribute to pelvic pain. Her fibroids may be contributing to her heavy bleeding. There is medical management for this. Hysterectomy is reasonable given her fibroids, pelvic pain, bleeding. Pt wishes to have concurrent hysterectomy.  In the event of malignancy or borderline tumor on frozen section, we will perform indicated staging procedures. We discussed that these procedures may include bilateral oophorectomy, omentectomy pelvic and/or para-aortic lymphadenectomy, peritoneal biopsies. We would also remove any tissue concerning for metastatic disease which could require additional procedures including bowel surgery. Bilateral oophorectomy would put patient in surgical menopausal. Pending pathology, could place on HRT as indicated. Risks of early surgical menopause reviewed. Will plan to retain ovaries if uninvolved and this is not a borderline or malignant process.   We will plan to start laparoscopically. Reviewed that given the size of the cystic masses, it is possible that surgery may not be able to be completed  MIS, in which case a laparotomy would be performed. Also reviewed possible need for minilap or hand morcellation in an endocatch bag for removal of uterus given fibroids, but may fit via the vagina. Risk and benefits of this approach reviewed. Unknown, but likely low risk of spread of occult malignancy reviewed.   Patient was consented for: robotic assisted total laparoscopic hysterectomy, bilateral salpingectomy, possible bilateral oophorectomy, staging, possible laparotomy on 07/05/23.  The risks of surgery were discussed in detail and she understands these to including but not limited to bleeding requiring a blood transfusion, infection, injury to adjacent organs (including but not limited to the bowels, bladder, ureters, nerves, blood vessels), thromboembolic events, wound separation, hernia, vaginal cuff separation, possible risk of lymphedema and lymphocyst if lymphadenectomy performed, unforseen complication, and possible need for re-exploration.  If the patient experiences any of these events, she understands that her hospitalization or recovery may be prolonged and that she may need to take additional medications for a prolonged period. The patient will receive DVT and antibiotic prophylaxis as indicated. She voiced a clear understanding. She had the opportunity to ask questions and informed consent was obtained today. She wishes to proceed.  She will proceed to the lab today for CEA, CA19.9. She does not require preoperative clearance. Her METs are >4.  All preoperative instructions were reviewed. Postoperative expectations were also reviewed. Written handouts were provided to the patient.  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 Will plan for 6 weeks off accordingly.   A copy of this note was sent to the patient's referring provider.  Clide Cliff, MD Gynecologic Oncology   Medical Decision Making I personally spent  TOTAL 65 minutes face-to-face and non-face-to-face in  the care of this patient, which includes all pre, intra, and post visit time  on the date of service.   ------------  CC: cystic pelvic masses, elevated ROMA  HISTORY OF PRESENT ILLNESS:  Tammy Vargas is a 43 y.o. woman who is seen in consultation at the request of Lorriane Shire, MD for evaluation of cystic pelvic masses, elevated ROMA.  Patient was being evaluated by OB/GYN in the setting of an adnexal mass and pelvic pain.  Patient reported lower abdominal pain for several years.  She had a pelvic ultrasound completed on 01/03/2023 which showed a 12.6 x 7 x 12.1 cm left adnexal mass, complex with possible internal septation and a right adnexal mass measuring 10.2 x 8.5 x 9.3 cm.  Her uterus was also noted to be enlarged to 12.4 cm with multiple fibroids measuring up to 3 cm.  She subsequently underwent tumor marker testing on 05/06/2023 which returned with a normal CA125 of 17, and elevated HE4 of 88.8, and an elevated premenopausal ROMA of 2.41.  An MRI pelvis was performed on 05/09/2023 which showed a serpiginous cystic lesion arising from the right adnexa measuring 14 x 12.2 x 9.2 cm and a serpiginous cystic lesion arising from the left adnexa measuring 12.0 x 11.8 x 7.8 cm, described as most consistent with large bilateral hydrosalpinges with no obvious solid component or suspicious contrast-enhancement.  Today patient endorses the history as above.  She notes achy pain in bilateral lower quadrants.  Sometimes she has a difficulty time emptying her bladder.  She is having regular bowel movements.  Does note some early satiety in the past couple months.  Otherwise denies abdominal bloating, or unintentional weight loss.  Patient additionally does report history of heavy menstrual bleeding with regular monthly periods.  She uses pads for her menstrual cycle and reports using about 20 a day.  Otherwise, patient has a family history of breast cancer in a maternal aunt and pancreatic cancer in a  maternal uncle.  She is not aware of any genetic testing.    PAST MEDICAL HISTORY: Past Medical History:  Diagnosis Date   Hyperlipidemia    Hypertension 01/04/2022    PAST SURGICAL HISTORY: No past surgical history on file.  OB/GYN HISTORY: OB History  Gravida Para Term Preterm AB Living  1 1 1  0 0 1  SAB IAB Ectopic Multiple Live Births  0 0 0 0 1    # Outcome Date GA Lbr Len/2nd Weight Sex Type Anes PTL Lv  1 Term 1999 [redacted]w[redacted]d    Vag-Spont   LIV      Age at menarche: 89 Age at menopause: n/a Hx of HRT: depot provera previously Hx of STI: no Last pap: 12/22/2022 NILM, HPV HR neg History of abnormal pap smears: no  SCREENING STUDIES:  Last mammogram: 01/2023 Last colonoscopy: n/a  MEDICATIONS:  Current Outpatient Medications:    amLODipine (NORVASC) 10 MG tablet, Take 1 tablet (10 mg total) by mouth daily., Disp: 30 tablet, Rfl: 3   atorvastatin (LIPITOR) 20 MG tablet, Take 1 tablet (20 mg total) by mouth daily., Disp: 30 tablet, Rfl: 3   chlorthalidone (HYGROTON) 25 MG tablet, Take 1 tablet (25 mg total) by mouth daily., Disp: 90 tablet, Rfl: 1   ibuprofen (ADVIL) 800 MG tablet, Take by mouth., Disp: , Rfl:    triamcinolone cream (KENALOG) 0.1 %, Apply 1 Application topically 2 (two) times daily., Disp: 80 g, Rfl: 1   nicotine (NICODERM CQ) 14 mg/24hr patch, Place 1 patch (14 mg total) onto the skin daily. (Patient not taking: Reported on 05/05/2023), Disp:  28 patch, Rfl: 1  ALLERGIES: No Known Allergies  FAMILY HISTORY: Family History  Problem Relation Age of Onset   Hypertension Mother    Hypertension Father    Breast cancer Maternal Aunt    Pancreatic cancer Maternal Uncle    Colon cancer Paternal Uncle    Ovarian cancer Neg Hx    Endometrial cancer Neg Hx    Prostate cancer Neg Hx     SOCIAL HISTORY: Social History   Socioeconomic History   Marital status: Single    Spouse name: Not on file   Number of children: Not on file   Years of education:  Not on file   Highest education level: Not on file  Occupational History   Not on file  Tobacco Use   Smoking status: Every Day    Current packs/day: 0.25    Average packs/day: 0.3 packs/day for 24.1 years (6.0 ttl pk-yrs)    Types: Cigarettes    Start date: 2001   Smokeless tobacco: Never  Vaping Use   Vaping status: Never Used  Substance and Sexual Activity   Alcohol use: Yes    Comment: occ   Drug use: Yes    Types: Marijuana   Sexual activity: Not Currently    Partners: Male    Birth control/protection: None  Other Topics Concern   Not on file  Social History Narrative   Not on file   Social Drivers of Health   Financial Resource Strain: Not on file  Food Insecurity: Food Insecurity Present (05/05/2023)   Hunger Vital Sign    Worried About Running Out of Food in the Last Year: Sometimes true    Ran Out of Food in the Last Year: Often true  Transportation Needs: No Transportation Needs (05/05/2023)   PRAPARE - Administrator, Civil Service (Medical): No    Lack of Transportation (Non-Medical): No  Physical Activity: Not on file  Stress: Not on file  Social Connections: Not on file  Intimate Partner Violence: Not At Risk (05/25/2023)   Humiliation, Afraid, Rape, and Kick questionnaire    Fear of Current or Ex-Partner: No    Emotionally Abused: No    Physically Abused: No    Sexually Abused: No    REVIEW OF SYSTEMS: New patient intake form was reviewed.  Complete 10-system review is negative except for the following: Joint pain, back pain, leg swelling, hot flashes  PHYSICAL EXAM: BP (!) 172/95 (BP Location: Left Arm, Patient Position: Sitting) Comment: Notified RN  Pulse 83   Temp 98.9 F (37.2 C) (Oral)   Resp 18   Ht 5\' 2"  (1.575 m)   Wt 172 lb (78 kg)   LMP 04/13/2023 (Exact Date)   SpO2 97%   BMI 31.46 kg/m  Constitutional: No acute distress. Neuro/Psych: Alert, oriented.  Head and Neck: Normocephalic, atraumatic. Neck symmetric without  masses. Sclera anicteric.  Respiratory: Normal work of breathing. Clear to auscultation bilaterally. Cardiovascular: Regular rate and rhythm, no murmurs, rubs, or gallops. Abdomen: Normoactive bowel sounds. Soft, non-distended, mild TTP in lower abdomen. Mass palpated in lower abdomen to approximately 1 cm below the umbilicus.  Extremities: Grossly normal range of motion. Warm, well perfused. No edema bilaterally. Skin: No rashes or lesions. Lymphatic: No cervical, supraclavicular, or inguinal adenopathy. Genitourinary: External genitalia without lesions. Urethral meatus without lesions or prolapse. On speculum exam, normal vaginal mucosa. Unable to visualize the cervix. Bimanual exam reveals cervix is pulled significantly cephalad and anterior behind the pubic symphysis, normal  on palpation. Approximately 10-12cm sized uterus anteriorly. Cystic masses filling posterior cul-de-sac and deviating the poster wall of the vagina, and rising to approximately 1cm below the umbilicus. Mild TTP on exam. Pt unable to tolerate a RVE. Exam chaperoned by Kimberly [place], CMA   LABORATORY AND RADIOLOGIC DATA: Outside medical records were reviewed to synthesize the above history, along with the history and physical obtained during the visit.  Outside laboratory, and imaging reports were reviewed, with pertinent results below.  I personally reviewed the outside images.  WBC  Date Value Ref Range Status  05/05/2023 7.0 3.4 - 10.8 x10E3/uL Final  07/06/2021 10.6 (H) 4.0 - 10.5 K/uL Final   Hemoglobin  Date Value Ref Range Status  05/05/2023 13.2 11.1 - 15.9 g/dL Final   Hematocrit  Date Value Ref Range Status  05/05/2023 40.6 34.0 - 46.6 % Final   Platelets  Date Value Ref Range Status  05/05/2023 271 150 - 450 x10E3/uL Final   Creatinine, Ser  Date Value Ref Range Status  05/05/2023 0.82 0.57 - 1.00 mg/dL Final   AST  Date Value Ref Range Status  10/27/2022 12 0 - 40 IU/L Final   ALT  Date  Value Ref Range Status  10/27/2022 13 0 - 32 IU/L Final   Cancer Antigen (CA) 125  Date Value Ref Range Status  05/05/2023 17.0 0.0 - 38.1 U/mL Final    Comment:    Roche [employer]ostics Electrochemiluminescence Immunoassay (ECLIA) Values obtained with different assay methods or kits cannot be used interchangeably.  Results cannot be interpreted as absolute evidence of the presence or absence of malignant disease.    Diagnosis  Date Value Ref Range Status  12/22/2022   Final   - Negative for intraepithelial lesion or malignancy (NILM)   HE4 (05/06/23): 88.8 Premenopausal ROMA (05/06/23): 2.41 (elevated risk)  MR PELVIS W WO CONTRAST 05/09/2023  Narrative CLINICAL DATA:  Adnexal masses chronic lower abdominal pain  EXAM: MRI PELVIS WITHOUT AND WITH CONTRAST  TECHNIQUE: Multiplanar multisequence MR imaging of the pelvis was performed both before and after administration of intravenous contrast.  CONTRAST:  6.29mL GADAVIST GADOBUTROL 1 MMOL/ML IV SOLN  COMPARISON:  Pelvic ultrasound, 01/03/2023  FINDINGS: Urinary Tract:  No abnormality visualized.  Bowel:  Unremarkable visualized pelvic bowel loops.  Vascular/Lymphatic: No pathologically enlarged lymph nodes. No significant vascular abnormality seen.  Reproductive: Uterine fibroids. Uterus measures 10.4 x 6.4 x 6.7 cm. Endometrial cavity is somewhat distorted and effaced by submucosal fibroids. Largest submucosal fibroid of the posterior fundus measures 3.9 x 3.2 x 3.2 cm (series 6, image 16, series 4, image 23). Very large, serpiginous cystic lesion arising from the right adnexa and predominantly situated in the posterior pelvis, measuring at least 14.0 x 12.2 x 9.2 cm (series 4, image 17, 30, series 6, image 20). Very large serpiginous cystic lesion arising from the left adnexa and predominantly situated in the anterior pelvis and low abdomen, measuring at least 12.0 x 11.8 x 7.6 cm (series 6, image 26, series 4, image  11). The ovaries proper are normal in appearance (series 4, image 13, 29).  Other:  None.  Musculoskeletal: No suspicious bone lesions identified.  IMPRESSION: 1. Very large, serpiginous cystic lesion arising from the right adnexa and predominantly situated in the posterior pelvis, measuring at least 14.0 x 12.2 x 9.2 cm. 2. Very large, serpiginous cystic lesion arising from the left adnexa and predominantly situated in the anterior pelvis and low abdomen, measuring at least 12.0 x 11.8 x  7.6 cm. 3. 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 No obvious solid component or suspicious contrast enhancement to suggest malignancy. 4. Uterine fibroids.   Electronically Signed By: Jearld Lesch M.D. On: 05/15/2023 17:58

## 2023-05-31 ENCOUNTER — Other Ambulatory Visit: Payer: Self-pay | Admitting: Gynecologic Oncology

## 2023-05-31 DIAGNOSIS — R978 Other abnormal tumor markers: Secondary | ICD-10-CM

## 2023-05-31 DIAGNOSIS — N9489 Other specified conditions associated with female genital organs and menstrual cycle: Secondary | ICD-10-CM

## 2023-05-31 LAB — CANCER ANTIGEN 19-9: CA 19-9: 21 U/mL (ref 0–35)

## 2023-06-03 ENCOUNTER — Telehealth: Payer: Self-pay

## 2023-06-03 NOTE — Telephone Encounter (Signed)
 FMLA forms received from Pt's employer, [employer]. Requested information filled out and faxed back to number given [phone].  Pt is aware

## 2023-06-06 ENCOUNTER — Encounter: Payer: Self-pay | Admitting: Psychiatry

## 2023-06-11 DIAGNOSIS — Z419 Encounter for procedure for purposes other than remedying health state, unspecified: Secondary | ICD-10-CM | POA: Diagnosis not present

## 2023-06-14 ENCOUNTER — Ambulatory Visit: Payer: Medicaid Other | Admitting: Family Medicine

## 2023-06-20 NOTE — Progress Notes (Signed)
 Anesthesia Review:  PCP: Cardiologist :  PPM/ ICD: Device Orders: Rep Notified:  Chest x-ray : EKG : Echo : Stress test: Cardiac Cath :   Activity level:  Sleep Study/ CPAP : Fasting Blood Sugar :      / Checks Blood Sugar -- times a day:    Blood Thinner/ Instructions /Last Dose: ASA / Instructions/ Last Dose :

## 2023-06-20 NOTE — Patient Instructions (Signed)
 SURGICAL WAITING ROOM VISITATION  Patients having surgery or a procedure may have no more than 2 support people in the waiting area - these visitors may rotate.    Children under the age of 33 must have an adult with them who is not the patient.  Due to an increase in RSV and influenza rates and associated hospitalizations, children ages 65 and under may not visit patients in [facility] hospitals.  Visitors with respiratory illnesses are discouraged from visiting and should remain at home.  If the patient needs to stay at the hospital during part of their recovery, the visitor guidelines for inpatient rooms apply. Pre-op nurse will coordinate an appropriate time for 1 support person to accompany patient in pre-op.  This support person may not rotate.    Please refer to the [facility] website for the visitor guidelines for Inpatients (after your surgery is over and you are in a regular room).       Your procedure is scheduled on: 07/05/23    Report to [facility] Main Entrance    Report to admitting at  0900 AM   Call this number if you have problems the morning of surgery [phone]   Do not eat food :After Midnight.              Eat a light diet the day before surgery.  Avoid gas producing foods.    After Midnight you may have the following liquids until _ 0800_____ AM DAY OF SURGERY  Water Non-Citrus Juices (without pulp, NO RED-Apple, White grape, White cranberry) Black Coffee (NO MILK/CREAM OR CREAMERS, sugar ok)  Clear Tea (NO MILK/CREAM OR CREAMERS, sugar ok) regular and decaf                             Plain Jell-O (NO RED)                                           Fruit ices (not with fruit pulp, NO RED)                                     Popsicles (NO RED)                                                               Sports drinks like Gatorade (NO RED)                           If you have questions, please contact your surgeon     Oral Hygiene is  also important to reduce your risk of infection.                                    Remember - BRUSH YOUR TEETH THE MORNING OF SURGERY WITH YOUR REGULAR TOOTHPASTE  DENTURES WILL BE REMOVED PRIOR TO SURGERY PLEASE DO NOT APPLY "Poly grip" OR ADHESIVES!!!   Do NOT smoke after Midnight  Stop all vitamins and herbal supplements 7 days before surgery.   Take these medicines the morning of surgery with A SIP OF WATER:  none   DO NOT TAKE ANY ORAL DIABETIC MEDICATIONS DAY OF YOUR SURGERY  Bring CPAP mask and tubing day of surgery.                              You may not have any metal on your body including hair pins, jewelry, and body piercing             Do not wear make-up, lotions, powders, perfumes/cologne, or deodorant  Do not wear nail polish including gel and S&S, artificial/acrylic nails, or any other type of covering on natural nails including finger and toenails. If you have artificial nails, gel coating, etc. that needs to be removed by a nail salon please have this removed prior to surgery or surgery may need to be canceled/ delayed if the surgeon/ anesthesia feels like they are unable to be safely monitored.   Do not shave  48 hours prior to surgery.               Men may shave face and neck.   Do not bring valuables to the hospital. [facility] IS NOT             RESPONSIBLE   FOR VALUABLES.   Contacts, glasses, dentures or bridgework may not be worn into surgery.   Bring small overnight bag day of surgery.   DO NOT BRING YOUR HOME MEDICATIONS TO THE HOSPITAL. PHARMACY WILL DISPENSE MEDICATIONS LISTED ON YOUR MEDICATION LIST TO YOU DURING YOUR ADMISSION IN THE HOSPITAL!    Patients discharged on the day of surgery will not be allowed to drive home.  Someone NEEDS to stay with you for the first 24 hours after anesthesia.   Special Instructions: Bring a copy of your healthcare power of attorney and living will documents the day of surgery if you haven't scanned them  before.              Please read over the following fact sheets you were given: IF YOU HAVE QUESTIONS ABOUT YOUR PRE-OP INSTRUCTIONS PLEASE CALL [phone]   If you received a COVID test during your pre-op visit  it is requested that you wear a mask when out in public, stay away from anyone that may not be feeling well and notify your surgeon if you develop symptoms. If you test positive for Covid or have been in contact with anyone that has tested positive in the last 10 days please notify you surgeon.    [facility] - Preparing for Surgery Before surgery, you can play an important role.  Because skin is not sterile, your skin needs to be as free of germs as possible.  You can reduce the number of germs on your skin by washing with CHG (chlorahexidine gluconate) soap before surgery.  CHG is an antiseptic cleaner which kills germs and bonds with the skin to continue killing germs even after washing. Please DO NOT use if you have an allergy to CHG or antibacterial soaps.  If your skin becomes reddened/irritated stop using the CHG and inform your nurse when you arrive at Short Stay. Do not shave (including legs and underarms) for at least 48 hours prior to the first CHG shower.  You may shave your face/neck. Please follow these instructions carefully:  1.  Shower with  CHG Soap the night before surgery and the  morning of Surgery.  2.  If you choose to wash your hair, wash your hair first as usual with your  normal  shampoo.  3.  After you shampoo, rinse your hair and body thoroughly to remove the  shampoo.                           4.  Use CHG as you would any other liquid soap.  You can apply chg directly  to the skin and wash                       Gently with a scrungie or clean washcloth.  5.  Apply the CHG Soap to your body ONLY FROM THE NECK DOWN.   Do not use on face/ open                           Wound or open sores. Avoid contact with eyes, ears mouth and genitals (private parts).                        Wash face,  Genitals (private parts) with your normal soap.             6.  Wash thoroughly, paying special attention to the area where your surgery  will be performed.  7.  Thoroughly rinse your body with warm water from the neck down.  8.  DO NOT shower/wash with your normal soap after using and rinsing off  the CHG Soap.                9.  Pat yourself dry with a clean towel.            10.  Wear clean pajamas.            11.  Place clean sheets on your bed the night of your first shower and do not  sleep with pets. Day of Surgery : Do not apply any lotions/deodorants the morning of surgery.  Please wear clean clothes to the hospital/surgery center.  FAILURE TO FOLLOW THESE INSTRUCTIONS MAY RESULT IN THE CANCELLATION OF YOUR SURGERY PATIENT SIGNATURE_________________________________  NURSE SIGNATURE__________________________________  ________________________________________________________________________

## 2023-06-22 ENCOUNTER — Other Ambulatory Visit: Payer: Self-pay

## 2023-06-22 ENCOUNTER — Ambulatory Visit: Payer: Self-pay | Admitting: Family Medicine

## 2023-06-22 ENCOUNTER — Encounter (HOSPITAL_COMMUNITY): Payer: Self-pay

## 2023-06-22 ENCOUNTER — Encounter (HOSPITAL_COMMUNITY)
Admission: RE | Admit: 2023-06-22 | Discharge: 2023-06-22 | Disposition: A | Discharge status: Home / Self Care | Payer: Medicaid Other | Source: Ambulatory Visit | Attending: Psychiatry | Admitting: Psychiatry

## 2023-06-22 ENCOUNTER — Emergency Department (HOSPITAL_COMMUNITY)
Admission: EM | Admit: 2023-06-22 | Discharge: 2023-06-22 | Disposition: A | Discharge status: Home / Self Care | Attending: Emergency Medicine | Admitting: Emergency Medicine

## 2023-06-22 VITALS — BP 168/105 | HR 77 | Temp 98.1°F | Resp 16 | Ht 62.0 in | Wt 155.0 lb

## 2023-06-22 DIAGNOSIS — Z01818 Encounter for other preprocedural examination: Secondary | ICD-10-CM | POA: Insufficient documentation

## 2023-06-22 DIAGNOSIS — N9489 Other specified conditions associated with female genital organs and menstrual cycle: Secondary | ICD-10-CM | POA: Insufficient documentation

## 2023-06-22 DIAGNOSIS — Z79899 Other long term (current) drug therapy: Secondary | ICD-10-CM | POA: Insufficient documentation

## 2023-06-22 DIAGNOSIS — E785 Hyperlipidemia, unspecified: Secondary | ICD-10-CM | POA: Diagnosis not present

## 2023-06-22 DIAGNOSIS — I10 Essential (primary) hypertension: Secondary | ICD-10-CM | POA: Insufficient documentation

## 2023-06-22 LAB — CBC WITH DIFFERENTIAL/PLATELET
Abs Immature Granulocytes: 0.02 10*3/uL (ref 0.00–0.07)
Basophils Absolute: 0.1 10*3/uL (ref 0.0–0.1)
Basophils Relative: 1 %
Eosinophils Absolute: 0.2 10*3/uL (ref 0.0–0.5)
Eosinophils Relative: 2 %
HCT: 38.1 % (ref 36.0–46.0)
Hemoglobin: 12.5 g/dL (ref 12.0–15.0)
Immature Granulocytes: 0 %
Lymphocytes Relative: 52 %
Lymphs Abs: 4.2 10*3/uL — ABNORMAL HIGH (ref 0.7–4.0)
MCH: 29.2 pg (ref 26.0–34.0)
MCHC: 32.8 g/dL (ref 30.0–36.0)
MCV: 89 fL (ref 80.0–100.0)
Monocytes Absolute: 0.5 10*3/uL (ref 0.1–1.0)
Monocytes Relative: 6 %
Neutro Abs: 3.2 10*3/uL (ref 1.7–7.7)
Neutrophils Relative %: 39 %
Platelets: 300 10*3/uL (ref 150–400)
RBC: 4.28 MIL/uL (ref 3.87–5.11)
RDW: 13.8 % (ref 11.5–15.5)
WBC: 8 10*3/uL (ref 4.0–10.5)
nRBC: 0 % (ref 0.0–0.2)

## 2023-06-22 LAB — BASIC METABOLIC PANEL
Anion gap: 9 (ref 5–15)
BUN: 8 mg/dL (ref 6–20)
CO2: 24 mmol/L (ref 22–32)
Calcium: 8.8 mg/dL — ABNORMAL LOW (ref 8.9–10.3)
Chloride: 104 mmol/L (ref 98–111)
Creatinine, Ser: 0.81 mg/dL (ref 0.44–1.00)
GFR, Estimated: >60 mL/min (ref >60)
Glucose, Bld: 82 mg/dL (ref 70–99)
Potassium: 4 mmol/L (ref 3.5–5.1)
Sodium: 137 mmol/L (ref 135–145)

## 2023-06-22 LAB — CBC
HCT: 41.6 % (ref 36.0–46.0)
Hemoglobin: 13.2 g/dL (ref 12.0–15.0)
MCH: 29.2 pg (ref 26.0–34.0)
MCHC: 31.7 g/dL (ref 30.0–36.0)
MCV: 92 fL (ref 80.0–100.0)
Platelets: 313 10*3/uL (ref 150–400)
RBC: 4.52 MIL/uL (ref 3.87–5.11)
RDW: 13.8 % (ref 11.5–15.5)
WBC: 7.2 10*3/uL (ref 4.0–10.5)
nRBC: 0 % (ref 0.0–0.2)

## 2023-06-22 LAB — TYPE AND SCREEN
ABO/RH(D): B POS
Antibody Screen: NEGATIVE

## 2023-06-22 LAB — COMPREHENSIVE METABOLIC PANEL
ALT: 13 U/L (ref 0–44)
AST: 16 U/L (ref 15–41)
Albumin: 3.5 g/dL (ref 3.5–5.0)
Alkaline Phosphatase: 85 U/L (ref 38–126)
Anion gap: 8 (ref 5–15)
BUN: 11 mg/dL (ref 6–20)
CO2: 23 mmol/L (ref 22–32)
Calcium: 8.7 mg/dL — ABNORMAL LOW (ref 8.9–10.3)
Chloride: 106 mmol/L (ref 98–111)
Creatinine, Ser: 0.81 mg/dL (ref 0.44–1.00)
GFR, Estimated: >60 mL/min (ref >60)
Glucose, Bld: 97 mg/dL (ref 70–99)
Potassium: 4.1 mmol/L (ref 3.5–5.1)
Sodium: 137 mmol/L (ref 135–145)
Total Bilirubin: 0.3 mg/dL (ref 0.0–1.2)
Total Protein: 7.5 g/dL (ref 6.5–8.1)

## 2023-06-22 MED ORDER — AMLODIPINE BESYLATE 10 MG PO TABS: 10.0000 mg | ORAL_TABLET | Freq: Every day | ORAL | 2 refills | Status: DC

## 2023-06-22 NOTE — Discharge Instructions (Addendum)
 Return for any problem.  ?

## 2023-06-22 NOTE — ED Triage Notes (Signed)
 Pt reports that she was at pretesting for surgery and her blood pressure was 150s systolic. Pt reports that she was on a blood pressure medication previously but was unable to get it and has not taken it for a month.

## 2023-06-22 NOTE — ED Provider Triage Note (Signed)
 Emergency Medicine Provider Triage Evaluation Note  Tammy Vargas , a 43 y.o. female  was evaluated in triage.  Pt complains of high blood pressure.  Patient sent from preop evaluation.  Patient was noted to be hypertensive.  Patient reports that she has been off of her blood pressure medicines for about 1 month.  Review of Systems  Positive: High blood pressure Negative: Chest pain, shortness of breath, other complaint  Physical Exam  BP (!) 142/100   Pulse 79   Temp 98.4 F (36.9 C)   Resp 18   Ht 5\' 2"  (1.575 m)   Wt 73.9 kg   SpO2 100%   BMI 29.81 kg/m  Gen:   Awake, no distress   Resp:  Normal effort  MSK:   Moves extremities without difficulty  Other:    Medical Decision Making  Medically screening exam initiated at 10:55 AM.  Appropriate orders placed.  Tammy Vargas was informed that the remainder of the evaluation will be completed by another provider, this initial triage assessment does not replace that evaluation, and the importance of remaining in the ED until their evaluation is complete.  Patient understands the importance of remaining for the entirety of her ED evaluation   Tammy Fines, MD 06/22/23 1058

## 2023-06-22 NOTE — ED Notes (Addendum)
Patient is in the room  

## 2023-06-22 NOTE — Telephone Encounter (Signed)
 Copied from CRM [id number]. Topic: Clinical - Red Word Triage >> Jun 22, 2023  9:12 AM Tammy Vargas wrote: Red Word that prompted transfer to Nurse Triage: Patient has checked in for pre-admission test for surgery is on 07/05/23 and has high BP. Nurse stated patient BP was too high 168/105 on right arm and 151-110 on left arm. Patient was advised  call primary care right away. Patient is out of BP medication, Patient called [facility], they didn't did not have BP medication listed.  Chief Complaint: elevated bp with numbness and tingling in arms and headache Symptoms: see above Frequency: constant Pertinent Negatives: Patient denies cp, sob Disposition: [x] ED /[] Urgent Care (no appt availability in office) / [] Appointment(In office/virtual)/ []  [facility]/ [] Home Care/ [] Refused Recommended Disposition /[] [facility]/ []  Follow-up with PCP Additional Notes: per protocol instructed to go to there ER for treatment; states headache and numbness and tingling in arms.  Care advice given, denies questions; Pcp office updated.  Reason for Disposition  [1] Systolic BP  >= 160 OR Diastolic >= 100 AND [2] cardiac (e.g., breathing difficulty, chest pain) or neurologic symptoms (e.g., new-onset blurred or double vision, unsteady gait)  Answer Assessment - Initial Assessment Questions 1. BLOOD PRESSURE: "What is the blood pressure?" "Did you take at least two measurements 5 minutes apart?"     Was at pre admission apt and nurse noted the following bp's; 168/105 and 151/110 2. ONSET: "When did you take your blood pressure?"     This am 3. HOW: "How did you take your blood pressure?" (e.g., automatic home BP monitor, visiting nurse)     Taking by nurse in pre admission testing  4. HISTORY: "Do you have a history of high blood pressure?"     yes 5. MEDICINES: "Are you taking any medicines for blood pressure?" "Have you missed any doses recently?"     Yes and currently out of bp meds  6.  OTHER SYMPTOMS: "Do you have any symptoms?" (e.g., blurred vision, chest pain, difficulty breathing, headache, weakness)     headache 7. PREGNANCY: "Is there any chance you are pregnant?" "When was your last menstrual period?"     Denies.  Protocols used: Blood Pressure - High-A-AH

## 2023-06-22 NOTE — ED Provider Notes (Signed)
 [place] EMERGENCY DEPARTMENT AT [facility] Provider Note   CSN: [id number] Arrival date & time: 06/22/23  1003     History  Chief Complaint  Patient presents with   Hypertension    Tammy Vargas is a 43 y.o. female.  43 year old female with prior medical history as detailed below presents for evaluation.  Patient was sent to the ED for evaluation of elevated blood pressure identified in preop evaluation earlier today.  Patient has been out of her Norvasc as previously prescribed.  Patient is primarily requesting Norvasc refill.  The history is provided by the patient and medical records.       Home Medications Prior to Admission medications   Medication Sig Start Date End Date Taking? Authorizing Provider  amLODipine (NORVASC) 10 MG tablet Take 1 tablet (10 mg total) by mouth daily. Patient not taking: Reported on 06/16/2023 12/22/22   Hoy Register, MD  atorvastatin (LIPITOR) 20 MG tablet Take 1 tablet (20 mg total) by mouth daily. Patient not taking: Reported on 06/16/2023 12/23/22   Hoy Register, MD  chlorthalidone (HYGROTON) 25 MG tablet Take 1 tablet (25 mg total) by mouth daily. Patient not taking: Reported on 06/16/2023 01/06/23   Hoy Register, MD  ibuprofen (ADVIL) 800 MG tablet Take by mouth. Patient not taking: Reported on 06/16/2023 03/18/23   [provider]  nicotine (NICODERM CQ) 14 mg/24hr patch Place 1 patch (14 mg total) onto the skin daily. Patient not taking: Reported on 06/16/2023 12/22/22   Hoy Register, MD  triamcinolone cream (KENALOG) 0.1 % Apply 1 Application topically 2 (two) times daily. Patient taking differently: Apply 1 Application topically 2 (two) times daily as needed (itching). 12/22/22   Hoy Register, MD      Allergies    Patient has no known allergies.    Review of Systems   Review of Systems  All other systems reviewed and are negative.   Physical Exam Updated Vital Signs BP (!) 142/100   Pulse 79   Temp  98.4 F (36.9 C)   Resp 18   Ht 5\' 2"  (1.575 m)   Wt 73.9 kg   SpO2 100%   BMI 29.81 kg/m  Physical Exam Vitals and nursing note reviewed.  Constitutional:      General: She is not in acute distress.    Appearance: Normal appearance. She is well-developed.  HENT:     Head: Normocephalic and atraumatic.  Eyes:     Conjunctiva/sclera: Conjunctivae normal.     Pupils: Pupils are equal, round, and reactive to light.  Cardiovascular:     Rate and Rhythm: Normal rate and regular rhythm.     Heart sounds: Normal heart sounds.  Pulmonary:     Effort: Pulmonary effort is normal. No respiratory distress.     Breath sounds: Normal breath sounds.  Abdominal:     General: There is no distension.     Palpations: Abdomen is soft.     Tenderness: There is no abdominal tenderness.  Musculoskeletal:        General: No deformity. Normal range of motion.     Cervical back: Normal range of motion and neck supple.  Skin:    General: Skin is warm and dry.  Neurological:     General: No focal deficit present.     Mental Status: She is alert and oriented to person, place, and time.     ED Results / Procedures / Treatments   Labs (all labs ordered are listed, but only  abnormal results are displayed) Labs Reviewed  CBC WITH DIFFERENTIAL/PLATELET - Abnormal; Notable for the following components:      Result Value   Lymphs Abs 4.2 (*)    All other components within normal limits  BASIC METABOLIC PANEL - Abnormal; Notable for the following components:   Calcium 8.8 (*)    All other components within normal limits    EKG None  Radiology No results found.  Procedures Procedures    Medications Ordered in ED Medications - No data to display  ED Course/ Medical Decision Making/ A&P                                 Medical Decision Making Amount and/or Complexity of Data Reviewed Labs: ordered.  Risk Prescription drug management.    Medical Screen Complete  This patient  presented to the ED with complaint of hypertension, medication refill.  This complaint involves an extensive number of treatment options. The initial differential diagnosis includes, but is not limited to, medication refill, hypertension, evidence of endorgan damage secondary to hypertension  This presentation is: Acute, Chronic, Self-Limited, Previously Undiagnosed, Uncertain Prognosis, Complicated, and Systemic Symptoms  Patient with known history of hypertension presents from preop evaluation with elevated blood pressure.  Patient is out of her previously prescribed Norvasc.  She is requesting a refill.  Screening labs obtained are without evidence of significant acute abnormality.  Patient understands need for close outpatient follow-up.  Strict return precautions given and understood.  Co morbidities that complicated the patient's evaluation  See HPI   Additional history obtained:  External records from outside sources obtained and reviewed including prior ED visits and prior Inpatient records.   Problem List / ED Course:  Hypertension, medication refill   Reevaluation:  After the interventions noted above, I reevaluated the patient and found that they have: improved Disposition:  After consideration of the diagnostic results and the patients response to treatment, I feel that the patent would benefit from close outpatient follow-up.          Final Clinical Impression(s) / ED Diagnoses Final diagnoses:  Hypertension, unspecified type    Rx / DC Orders ED Discharge Orders          Ordered    amLODipine (NORVASC) 10 MG tablet  Daily        06/22/23 1420              [name], MD 06/22/23 1523

## 2023-06-23 NOTE — Progress Notes (Signed)
 Case: [id number] Date/Time: 07/05/23 1045   Procedures:      XI ROBOTIC ASSISTED LAPAROSCOPIC HYSTERECTOMY AND SALPINGECTOMY AND POSSIBLE BILATERAL OOPHORECTOMY (Bilateral)     POSSIBLE LAPAROTOMY WITH POSSIBLE STAGING   Anesthesia type: General   Pre-op diagnosis: ADNEXAL MASS   Location: [facility] ROOM 05 / [facility]   Surgeons: Clide Cliff, MD       DISCUSSION: Tammy Vargas is a 43 yo female who presents to PAT prior to surgery above. PMH of HTN and HLD.  Patient came to PAT with elevated BP. She has been off her BP meds for some time now, at least a month. She was advised to f/u with her PCP to get refills. She called her PCP office and they directed her to the ED where she got a refill of her Amlodipine. Recommended eval DOS.  VS: BP (!) 168/105   Pulse 77   Temp 36.7 C (Oral)   Resp 16   Ht 5\' 2"  (1.575 m)   Wt 70.3 kg   SpO2 100%   BMI 28.35 kg/m   PROVIDERS: Hoy Register, MD   LABS: Labs reviewed: Acceptable for surgery. (all labs ordered are listed, but only abnormal results are displayed)  Labs Reviewed  COMPREHENSIVE METABOLIC PANEL - Abnormal; Notable for the following components:      Result Value   Calcium 8.7 (*)    All other components within normal limits  CBC  TYPE AND SCREEN     IMAGES:   EKG 06/22/23:  NSR, rate 70   CV:  Past Medical History:  Diagnosis Date   Hyperlipidemia    Hypertension 01/04/2022    History reviewed. No pertinent surgical history.  MEDICATIONS:  amLODipine (NORVASC) 10 MG tablet   amLODipine (NORVASC) 10 MG tablet   atorvastatin (LIPITOR) 20 MG tablet   chlorthalidone (HYGROTON) 25 MG tablet   ibuprofen (ADVIL) 800 MG tablet   nicotine (NICODERM CQ) 14 mg/24hr patch   triamcinolone cream (KENALOG) 0.1 %   No current facility-administered medications for this encounter.   Marcille Blanco [facility] Surgical Short Stay/Anesthesiology [facility] Phone [phone] 06/23/2023 9:46 AM

## 2023-06-23 NOTE — Anesthesia Preprocedure Evaluation (Addendum)
 Anesthesia Evaluation  Patient identified by MRN, date of birth, ID band Patient awake    Reviewed: Allergy & Precautions, NPO status , Patient's Chart, lab work & pertinent test results, reviewed documented beta blocker date and time   Airway Mallampati: III  TM Distance: >3 FB     Dental no notable dental hx. (+) Dental Advisory Given   Pulmonary Current Smoker and Patient abstained from smoking.   Pulmonary exam normal breath sounds clear to auscultation       Cardiovascular hypertension, Pt. on medications Normal cardiovascular exam Rhythm:Regular Rate:Normal     Neuro/Psych negative neurological ROS  negative psych ROS   GI/Hepatic negative GI ROS, Neg liver ROS,,,  Endo/Other  Hyperlipidemia  Renal/GU negative Renal ROS  negative genitourinary   Musculoskeletal negative musculoskeletal ROS (+)    Abdominal   Peds  Hematology negative hematology ROS (+)   Anesthesia Other Findings   Reproductive/Obstetrics Adnesal Mass                              Anesthesia Physical Anesthesia Plan  ASA: 2  Anesthesia Plan: General   Post-op Pain Management: Dilaudid IV, Precedex, Tylenol PO (pre-op)*, Ketamine IV* and Lidocaine infusion*   Induction: Intravenous  PONV Risk Score and Plan: 4 or greater and Treatment may vary due to age or medical condition, Midazolam, Dexamethasone and Ondansetron  Airway Management Planned: Oral ETT  Additional Equipment: None  Intra-op Plan:   Post-operative Plan: Extubation in OR  Informed Consent: I have reviewed the patients History and Physical, chart, labs and discussed the procedure including the risks, benefits and alternatives for the proposed anesthesia with the patient or authorized representative who has indicated his/her understanding and acceptance.     Dental advisory given  Plan Discussed with: CRNA and  Anesthesiologist  Anesthesia Plan Comments: (See PAT note from 3/12 by Sherlie Ban PA-C )         Anesthesia Quick Evaluation

## 2023-07-04 ENCOUNTER — Telehealth: Payer: Self-pay | Admitting: *Deleted

## 2023-07-04 ENCOUNTER — Other Ambulatory Visit: Payer: Self-pay | Admitting: Gynecologic Oncology

## 2023-07-04 DIAGNOSIS — R978 Other abnormal tumor markers: Secondary | ICD-10-CM

## 2023-07-04 DIAGNOSIS — N9489 Other specified conditions associated with female genital organs and menstrual cycle: Secondary | ICD-10-CM

## 2023-07-04 MED ORDER — TRAMADOL HCL 50 MG PO TABS: 50.0000 mg | ORAL_TABLET | Freq: Four times a day (QID) | ORAL | 0 refills | Status: AC | PRN

## 2023-07-04 MED ORDER — SENNOSIDES-DOCUSATE SODIUM 8.6-50 MG PO TABS: 2.0000 | ORAL_TABLET | Freq: Every day | ORAL | 0 refills | Status: DC

## 2023-07-04 NOTE — Telephone Encounter (Signed)
Telephone call to check on pre-operative status.  Patient compliant with pre-operative instructions.  Reinforced nothing to eat after midnight. Clear liquids until 0745. Patient to arrive at 0845.  No questions or concerns voiced.  Instructed to call for any needs.  °

## 2023-07-04 NOTE — Progress Notes (Signed)
Post-op meds sent in preop. 

## 2023-07-05 ENCOUNTER — Other Ambulatory Visit: Payer: Self-pay

## 2023-07-05 ENCOUNTER — Encounter (HOSPITAL_COMMUNITY)
Admission: RE | Disposition: A | Discharge status: Home / Self Care | Payer: Self-pay | Source: Ambulatory Visit | Attending: Psychiatry

## 2023-07-05 ENCOUNTER — Ambulatory Visit (HOSPITAL_BASED_OUTPATIENT_CLINIC_OR_DEPARTMENT_OTHER): Payer: Self-pay | Admitting: Anesthesiology

## 2023-07-05 ENCOUNTER — Encounter (HOSPITAL_COMMUNITY): Payer: Self-pay | Admitting: Psychiatry

## 2023-07-05 ENCOUNTER — Ambulatory Visit (HOSPITAL_COMMUNITY)
Admission: RE | Admit: 2023-07-05 | Discharge: 2023-07-05 | Disposition: A | Discharge status: Home / Self Care | Payer: No Typology Code available for payment source | Source: Ambulatory Visit | Attending: Psychiatry | Admitting: Psychiatry

## 2023-07-05 ENCOUNTER — Ambulatory Visit (HOSPITAL_COMMUNITY): Payer: Self-pay | Admitting: Medical

## 2023-07-05 DIAGNOSIS — D259 Leiomyoma of uterus, unspecified: Secondary | ICD-10-CM

## 2023-07-05 DIAGNOSIS — N831 Corpus luteum cyst of ovary, unspecified side: Secondary | ICD-10-CM | POA: Insufficient documentation

## 2023-07-05 DIAGNOSIS — D271 Benign neoplasm of left ovary: Secondary | ICD-10-CM

## 2023-07-05 DIAGNOSIS — N7011 Chronic salpingitis: Secondary | ICD-10-CM | POA: Diagnosis not present

## 2023-07-05 DIAGNOSIS — R978 Other abnormal tumor markers: Secondary | ICD-10-CM | POA: Diagnosis not present

## 2023-07-05 DIAGNOSIS — N8312 Corpus luteum cyst of left ovary: Secondary | ICD-10-CM

## 2023-07-05 DIAGNOSIS — N939 Abnormal uterine and vaginal bleeding, unspecified: Secondary | ICD-10-CM | POA: Insufficient documentation

## 2023-07-05 DIAGNOSIS — R1909 Other intra-abdominal and pelvic swelling, mass and lump: Secondary | ICD-10-CM | POA: Diagnosis not present

## 2023-07-05 DIAGNOSIS — G8929 Other chronic pain: Secondary | ICD-10-CM | POA: Diagnosis not present

## 2023-07-05 DIAGNOSIS — I10 Essential (primary) hypertension: Secondary | ICD-10-CM

## 2023-07-05 DIAGNOSIS — K682 Retroperitoneal fibrosis: Secondary | ICD-10-CM

## 2023-07-05 DIAGNOSIS — D279 Benign neoplasm of unspecified ovary: Secondary | ICD-10-CM | POA: Diagnosis not present

## 2023-07-05 DIAGNOSIS — N9489 Other specified conditions associated with female genital organs and menstrual cycle: Secondary | ICD-10-CM

## 2023-07-05 DIAGNOSIS — Z01818 Encounter for other preprocedural examination: Secondary | ICD-10-CM

## 2023-07-05 DIAGNOSIS — D270 Benign neoplasm of right ovary: Secondary | ICD-10-CM | POA: Diagnosis not present

## 2023-07-05 DIAGNOSIS — N8311 Corpus luteum cyst of right ovary: Secondary | ICD-10-CM | POA: Diagnosis not present

## 2023-07-05 DIAGNOSIS — N839 Noninflammatory disorder of ovary, fallopian tube and broad ligament, unspecified: Secondary | ICD-10-CM | POA: Diagnosis not present

## 2023-07-05 DIAGNOSIS — F17200 Nicotine dependence, unspecified, uncomplicated: Secondary | ICD-10-CM | POA: Diagnosis not present

## 2023-07-05 DIAGNOSIS — F17210 Nicotine dependence, cigarettes, uncomplicated: Secondary | ICD-10-CM | POA: Diagnosis not present

## 2023-07-05 HISTORY — PX: HYSTERECTOMY,TOTAL,BILAT SALPINGO-OOPHORECTOMY, ROBOT, LAP: SHX7359

## 2023-07-05 LAB — ABO/RH: ABO/RH(D): B POS

## 2023-07-05 LAB — POCT PREGNANCY, URINE: Preg Test, Ur: NEGATIVE

## 2023-07-05 SURGERY — HYSTERECTOMY, TOTAL, BILATERAL SAPLINGO-OOPHORECTOMY, ROBOT ASSISTED, LAPAROSCOPIC, D5
Anesthesia: General | Laterality: Bilateral

## 2023-07-05 MED ORDER — SUGAMMADEX SODIUM 200 MG/2ML IV SOLN
INTRAVENOUS | Status: DC | PRN
  Administered 2023-07-05: 200 mg via INTRAVENOUS

## 2023-07-05 MED ORDER — ROCURONIUM BROMIDE 10 MG/ML (PF) SYRINGE
PREFILLED_SYRINGE | INTRAVENOUS | Status: AC
  Filled 2023-07-05: qty 10

## 2023-07-05 MED ORDER — ONDANSETRON HCL 4 MG/2ML IJ SOLN: 4.0000 mg | Freq: Once | INTRAMUSCULAR | Status: DC | PRN

## 2023-07-05 MED ORDER — PROPOFOL 10 MG/ML IV BOLUS
INTRAVENOUS | Status: AC
  Filled 2023-07-05: qty 20

## 2023-07-05 MED ORDER — OXYCODONE HCL 5 MG/5ML PO SOLN: 5.0000 mg | Freq: Once | ORAL | Status: AC | PRN

## 2023-07-05 MED ORDER — ONDANSETRON HCL 4 MG/2ML IJ SOLN
INTRAMUSCULAR | Status: AC
  Filled 2023-07-05: qty 2

## 2023-07-05 MED ORDER — STERILE WATER FOR IRRIGATION IR SOLN
Status: DC | PRN
  Administered 2023-07-05: 1000 mL

## 2023-07-05 MED ORDER — DEXAMETHASONE SODIUM PHOSPHATE 10 MG/ML IJ SOLN
INTRAMUSCULAR | Status: AC
  Filled 2023-07-05: qty 1

## 2023-07-05 MED ORDER — ONDANSETRON HCL 4 MG/2ML IJ SOLN
INTRAMUSCULAR | Status: DC | PRN
  Administered 2023-07-05: 4 mg via INTRAVENOUS

## 2023-07-05 MED ORDER — DEXMEDETOMIDINE HCL IN NACL 80 MCG/20ML IV SOLN
INTRAVENOUS | Status: DC | PRN
  Administered 2023-07-05 (×3): 4 ug via INTRAVENOUS
  Administered 2023-07-05: 8 ug via INTRAVENOUS

## 2023-07-05 MED ORDER — METRONIDAZOLE 500 MG/100ML IV SOLN
500.0000 mg | INTRAVENOUS | Status: AC
  Administered 2023-07-05: 500 mg via INTRAVENOUS
  Filled 2023-07-05: qty 100

## 2023-07-05 MED ORDER — FENTANYL CITRATE (PF) 100 MCG/2ML IJ SOLN
INTRAMUSCULAR | Status: DC | PRN
  Administered 2023-07-05: 50 ug via INTRAVENOUS
  Administered 2023-07-05: 25 ug via INTRAVENOUS
  Administered 2023-07-05: 50 ug via INTRAVENOUS
  Administered 2023-07-05 (×3): 25 ug via INTRAVENOUS

## 2023-07-05 MED ORDER — HEPARIN SODIUM (PORCINE) 5000 UNIT/ML IJ SOLN
5000.0000 [IU] | INTRAMUSCULAR | Status: AC
  Administered 2023-07-05: 5000 [IU] via SUBCUTANEOUS
  Filled 2023-07-05: qty 1

## 2023-07-05 MED ORDER — ESMOLOL HCL 100 MG/10ML IV SOLN
INTRAVENOUS | Status: DC | PRN
  Administered 2023-07-05 (×3): 20 mg via INTRAVENOUS

## 2023-07-05 MED ORDER — PROPOFOL 10 MG/ML IV BOLUS
INTRAVENOUS | Status: DC | PRN
  Administered 2023-07-05: 200 mg via INTRAVENOUS

## 2023-07-05 MED ORDER — CHLORHEXIDINE GLUCONATE 0.12 % MT SOLN
15.0000 mL | Freq: Once | OROMUCOSAL | Status: AC
  Administered 2023-07-05: 15 mL via OROMUCOSAL

## 2023-07-05 MED ORDER — LIDOCAINE HCL (CARDIAC) PF 100 MG/5ML IV SOSY
PREFILLED_SYRINGE | INTRAVENOUS | Status: DC | PRN
  Administered 2023-07-05: 60 mg via INTRAVENOUS

## 2023-07-05 MED ORDER — LIDOCAINE HCL (PF) 2 % IJ SOLN
INTRAMUSCULAR | Status: AC
  Filled 2023-07-05: qty 5

## 2023-07-05 MED ORDER — DROPERIDOL 2.5 MG/ML IJ SOLN: 0.6250 mg | Freq: Once | INTRAMUSCULAR | Status: DC | PRN

## 2023-07-05 MED ORDER — GABAPENTIN 300 MG PO CAPS
300.0000 mg | ORAL_CAPSULE | ORAL | Status: AC
  Administered 2023-07-05: 300 mg via ORAL
  Filled 2023-07-05: qty 1

## 2023-07-05 MED ORDER — HYDRALAZINE HCL 20 MG/ML IJ SOLN
INTRAMUSCULAR | Status: DC | PRN
  Administered 2023-07-05 (×2): 5 mg via INTRAVENOUS

## 2023-07-05 MED ORDER — HYDROMORPHONE HCL 1 MG/ML IJ SOLN
INTRAMUSCULAR | Status: DC | PRN
  Administered 2023-07-05: 1 mg via INTRAVENOUS

## 2023-07-05 MED ORDER — ROCURONIUM BROMIDE 100 MG/10ML IV SOLN
INTRAVENOUS | Status: DC | PRN
  Administered 2023-07-05: 80 mg via INTRAVENOUS
  Administered 2023-07-05: 10 mg via INTRAVENOUS
  Administered 2023-07-05: 20 mg via INTRAVENOUS
  Administered 2023-07-05: 10 mg via INTRAVENOUS

## 2023-07-05 MED ORDER — DEXAMETHASONE SODIUM PHOSPHATE 10 MG/ML IJ SOLN
INTRAMUSCULAR | Status: DC | PRN
  Administered 2023-07-05: 10 mg via INTRAVENOUS

## 2023-07-05 MED ORDER — CEFAZOLIN SODIUM-DEXTROSE 2-4 GM/100ML-% IV SOLN
2.0000 g | INTRAVENOUS | Status: AC
  Administered 2023-07-05: 2 g via INTRAVENOUS
  Filled 2023-07-05: qty 100

## 2023-07-05 MED ORDER — ORAL CARE MOUTH RINSE: 15.0000 mL | Freq: Once | OROMUCOSAL | Status: AC

## 2023-07-05 MED ORDER — FENTANYL CITRATE (PF) 100 MCG/2ML IJ SOLN
INTRAMUSCULAR | Status: AC
  Filled 2023-07-05: qty 2

## 2023-07-05 MED ORDER — LACTATED RINGERS IV SOLN: INTRAVENOUS | Status: DC

## 2023-07-05 MED ORDER — ESTRADIOL 0.1 MG/24HR TD PTWK: 0.1000 mg | MEDICATED_PATCH | TRANSDERMAL | 12 refills | Status: DC

## 2023-07-05 MED ORDER — LACTATED RINGERS IR SOLN
Status: DC | PRN
Start: 2023-07-05 — End: 2023-07-05
  Administered 2023-07-05: 1000 mL

## 2023-07-05 MED ORDER — LIDOCAINE HCL 2 % IJ SOLN
INTRAMUSCULAR | Status: AC
  Filled 2023-07-05: qty 20

## 2023-07-05 MED ORDER — OXYCODONE HCL 5 MG PO TABS
5.0000 mg | ORAL_TABLET | Freq: Once | ORAL | Status: AC | PRN
  Administered 2023-07-05: 5 mg via ORAL

## 2023-07-05 MED ORDER — PROPOFOL 500 MG/50ML IV EMUL
INTRAVENOUS | Status: DC | PRN
  Administered 2023-07-05: 50 ug/kg/min via INTRAVENOUS

## 2023-07-05 MED ORDER — HYDROMORPHONE HCL 1 MG/ML IJ SOLN
0.2500 mg | INTRAMUSCULAR | Status: DC | PRN
  Administered 2023-07-05 (×4): 0.5 mg via INTRAVENOUS

## 2023-07-05 MED ORDER — DEXAMETHASONE SODIUM PHOSPHATE 4 MG/ML IJ SOLN: 4.0000 mg | INTRAMUSCULAR | Status: DC

## 2023-07-05 MED ORDER — HYDROMORPHONE HCL 1 MG/ML IJ SOLN
INTRAMUSCULAR | Status: AC
  Filled 2023-07-05: qty 1

## 2023-07-05 MED ORDER — HYDROMORPHONE HCL 2 MG/ML IJ SOLN
INTRAMUSCULAR | Status: AC
  Filled 2023-07-05: qty 1

## 2023-07-05 MED ORDER — SUGAMMADEX SODIUM 200 MG/2ML IV SOLN
INTRAVENOUS | Status: AC
  Filled 2023-07-05: qty 2

## 2023-07-05 MED ORDER — ACETAMINOPHEN 500 MG PO TABS
1000.0000 mg | ORAL_TABLET | ORAL | Status: AC
  Administered 2023-07-05: 1000 mg via ORAL
  Filled 2023-07-05: qty 2

## 2023-07-05 MED ORDER — SCOPOLAMINE 1 MG/3DAYS TD PT72
1.0000 | MEDICATED_PATCH | TRANSDERMAL | Status: DC
  Administered 2023-07-05: 1.5 mg via TRANSDERMAL
  Filled 2023-07-05: qty 1

## 2023-07-05 MED ORDER — LIDOCAINE HCL (PF) 2 % IJ SOLN
INTRAMUSCULAR | Status: DC | PRN
  Administered 2023-07-05: 1.5 mg/kg/h via INTRADERMAL

## 2023-07-05 MED ORDER — ESMOLOL HCL 100 MG/10ML IV SOLN
INTRAVENOUS | Status: AC
  Filled 2023-07-05: qty 10

## 2023-07-05 MED ORDER — OXYCODONE HCL 5 MG PO TABS
ORAL_TABLET | ORAL | Status: AC
  Filled 2023-07-05: qty 1

## 2023-07-05 MED ORDER — BUPIVACAINE HCL (PF) 0.25 % IJ SOLN
INTRAMUSCULAR | Status: AC
  Filled 2023-07-05: qty 30

## 2023-07-05 MED ORDER — HYDRALAZINE HCL 20 MG/ML IJ SOLN
INTRAMUSCULAR | Status: AC
  Filled 2023-07-05: qty 1

## 2023-07-05 MED ORDER — MIDAZOLAM HCL 2 MG/2ML IJ SOLN
INTRAMUSCULAR | Status: AC
  Filled 2023-07-05: qty 2

## 2023-07-05 MED ORDER — PROPOFOL 1000 MG/100ML IV EMUL
INTRAVENOUS | Status: AC
  Filled 2023-07-05: qty 100

## 2023-07-05 MED ORDER — MIDAZOLAM HCL 5 MG/5ML IJ SOLN
INTRAMUSCULAR | Status: DC | PRN
  Administered 2023-07-05: 2 mg via INTRAVENOUS

## 2023-07-05 MED ORDER — BUPIVACAINE HCL 0.25 % IJ SOLN
INTRAMUSCULAR | Status: DC | PRN
  Administered 2023-07-05: 25 mL

## 2023-07-05 SURGICAL SUPPLY — 70 items
APPLICATOR SURGIFLO ENDO (HEMOSTASIS) IMPLANT
BAG LAPAROSCOPIC 12 15 PORT 16 (BASKET) ×2 IMPLANT
BAG RETRIEVAL 12/15 (BASKET) ×4 IMPLANT
BLADE SURG SZ10 CARB STEEL (BLADE) IMPLANT
COVER BACK TABLE 60X90IN (DRAPES) ×2 IMPLANT
COVER TIP SHEARS 8 DVNC (MISCELLANEOUS) ×2 IMPLANT
DERMABOND ADVANCED .7 DNX12 (GAUZE/BANDAGES/DRESSINGS) ×2 IMPLANT
DRAPE ARM DVNC X/XI (DISPOSABLE) ×8 IMPLANT
DRAPE COLUMN DVNC XI (DISPOSABLE) ×2 IMPLANT
DRAPE SHEET LG 3/4 BI-LAMINATE (DRAPES) ×2 IMPLANT
DRAPE SURG IRRIG POUCH 19X23 (DRAPES) ×2 IMPLANT
DRIVER NDL MEGA SUTCUT DVNCXI (INSTRUMENTS) ×1 IMPLANT
DRIVER NDLE MEGA SUTCUT DVNCXI (INSTRUMENTS) ×2 IMPLANT
DRSG OPSITE POSTOP 4X6 (GAUZE/BANDAGES/DRESSINGS) IMPLANT
DRSG OPSITE POSTOP 4X8 (GAUZE/BANDAGES/DRESSINGS) IMPLANT
ELECT PENCIL ROCKER SW 15FT (MISCELLANEOUS) IMPLANT
ELECT REM PT RETURN 15FT ADLT (MISCELLANEOUS) ×2 IMPLANT
FORCEPS BPLR FENES DVNC XI (FORCEP) ×2 IMPLANT
FORCEPS PROGRASP DVNC XI (FORCEP) ×2 IMPLANT
GAUZE 4X4 16PLY ~~LOC~~+RFID DBL (SPONGE) ×2 IMPLANT
GLOVE BIO SURGEON STRL SZ 6.5 (GLOVE) ×2 IMPLANT
GLOVE BIOGEL PI IND STRL 6.5 (GLOVE) ×4 IMPLANT
GLOVE BIOGEL PI MICRO STRL 6 (GLOVE) ×8 IMPLANT
GOWN STRL REUS W/ TWL LRG LVL3 (GOWN DISPOSABLE) ×8 IMPLANT
GRASPER SUT TROCAR 14GX15 (MISCELLANEOUS) ×1 IMPLANT
HOLDER FOLEY CATH W/STRAP (MISCELLANEOUS) IMPLANT
IRRIG SUCT STRYKERFLOW 2 WTIP (MISCELLANEOUS) ×2 IMPLANT
IRRIGATION SUCT STRKRFLW 2 WTP (MISCELLANEOUS) ×2 IMPLANT
KIT PROCEDURE DVNC SI (MISCELLANEOUS) IMPLANT
KIT TURNOVER KIT A (KITS) IMPLANT
LIGASURE IMPACT 36 18CM CVD LR (INSTRUMENTS) IMPLANT
MANIPULATOR ADVINCU DEL 3.0 PL (MISCELLANEOUS) ×1 IMPLANT
MANIPULATOR ADVINCU DEL 3.5 PL (MISCELLANEOUS) IMPLANT
MANIPULATOR UTERINE 4.5 ZUMI (MISCELLANEOUS) IMPLANT
NDL HYPO 21X1.5 SAFETY (NEEDLE) ×1 IMPLANT
NDL INSUFFLATION 14GA 120MM (NEEDLE) IMPLANT
NDL SPNL 20GX3.5 QUINCKE YW (NEEDLE) IMPLANT
NEEDLE HYPO 21X1.5 SAFETY (NEEDLE) ×2 IMPLANT
NEEDLE INSUFFLATION 14GA 120MM (NEEDLE) IMPLANT
NEEDLE SPNL 20GX3.5 QUINCKE YW (NEEDLE) IMPLANT
OBTURATOR OPTICAL STND 8 DVNC (TROCAR) ×2 IMPLANT
OBTURATOR OPTICALSTD 8 DVNC (TROCAR) ×2 IMPLANT
PACK ROBOT GYN CUSTOM WL (TRAY / TRAY PROCEDURE) ×2 IMPLANT
PAD ARMBOARD POSITIONER FOAM (MISCELLANEOUS) ×2 IMPLANT
PAD POSITIONING PINK XL (MISCELLANEOUS) ×2 IMPLANT
PORT ACCESS TROCAR AIRSEAL 12 (TROCAR) ×1 IMPLANT
SCISSORS MNPLR CVD DVNC XI (INSTRUMENTS) ×2 IMPLANT
SCRUB CHG 4% DYNA-HEX 4OZ (MISCELLANEOUS) ×4 IMPLANT
SEAL UNIV 5-12 XI (MISCELLANEOUS) ×8 IMPLANT
SET TRI-LUMEN FLTR TB AIRSEAL (TUBING) ×2 IMPLANT
SPIKE FLUID TRANSFER (MISCELLANEOUS) ×2 IMPLANT
SPONGE T-LAP 18X18 ~~LOC~~+RFID (SPONGE) IMPLANT
SURGIFLO W/THROMBIN 8M KIT (HEMOSTASIS) IMPLANT
SUT MNCRL AB 4-0 PS2 18 (SUTURE) IMPLANT
SUT PDS AB 1 TP1 54 (SUTURE) IMPLANT
SUT VIC AB 0 CT1 27XBRD ANTBC (SUTURE) IMPLANT
SUT VIC AB 2-0 CT1 TAPERPNT 27 (SUTURE) IMPLANT
SUT VIC AB 4-0 PS2 18 (SUTURE) ×4 IMPLANT
SUT VICRYL 0 27 CT2 27 ABS (SUTURE) ×2 IMPLANT
SYR 10ML LL (SYRINGE) IMPLANT
SYS BAG RETRIEVAL 10MM (BASKET) IMPLANT
SYS WOUND ALEXIS 18CM MED (MISCELLANEOUS) IMPLANT
SYSTEM BAG RETRIEVAL 10MM (BASKET) IMPLANT
SYSTEM WOUND ALEXIS 18CM MED (MISCELLANEOUS) IMPLANT
TRAP SPECIMEN MUCUS 40CC (MISCELLANEOUS) IMPLANT
TRAY FOLEY MTR SLVR 16FR STAT (SET/KITS/TRAYS/PACK) ×2 IMPLANT
TROCAR PORT AIRSEAL 5X120 (TROCAR) IMPLANT
UNDERPAD 30X36 HEAVY ABSORB (UNDERPADS AND DIAPERS) ×4 IMPLANT
WATER STERILE IRR 1000ML POUR (IV SOLUTION) ×2 IMPLANT
YANKAUER SUCT BULB TIP 10FT TU (MISCELLANEOUS) IMPLANT

## 2023-07-05 NOTE — Discharge Instructions (Addendum)
 AFTER SURGERY INSTRUCTIONS   Return to work: 4-6 weeks if applicable   Activity: 1. Be up and out of the bed during the day.  Take a nap if needed.  You may walk up steps but be careful and use the hand rail.  Stair climbing will tire you more than you think, you may need to stop part way and rest.    2. No lifting or straining for 6 weeks over 10 pounds. No pushing, pulling, straining for 6 weeks.   3. No driving for 2-95 days when the following criteria have been met: Do not drive if you are taking narcotic pain medicine and make sure that your reaction time has returned.    4. You can shower as soon as the next day after surgery. Shower daily.  Use your regular soap and water (not directly on the incision) and pat your incision(s) dry afterwards; don't rub.  No tub baths or submerging your body in water until cleared by your surgeon. If you have the soap that was given to you by pre-surgical testing that was used before surgery, you do not need to use it afterwards because this can irritate your incisions.    5. No sexual activity and nothing in the vagina for 12 weeks.   6. You may experience a small amount of clear drainage from your incisions, which is normal.  If the drainage persists, increases, or changes color please call the office.   7. Do not use creams, lotions, or ointments such as neosporin on your incisions after surgery until advised by your surgeon because they can cause removal of the dermabond glue on your incisions.     8. You may experience vaginal spotting after surgery or when the stitches at the top of the vagina begin to dissolve.  The spotting is normal but if you experience heavy bleeding, call our office.   9. Take Tylenol or ibuprofen first for pain if you are able to take these medications and only use narcotic pain medication for severe pain not relieved by the Tylenol or Ibuprofen.  Monitor your Tylenol intake to a max of 4,000 mg in a 24 hour period. You can  alternate these medications after surgery.   Diet: 1. Low sodium Heart Healthy Diet is recommended but you are cleared to resume your normal (before surgery) diet after your procedure.   2. It is safe to use a laxative, such as Miralax or Colace, if you have difficulty moving your bowels before surgery. You have been prescribed Sennakot-S to take at bedtime every evening after surgery to keep bowel movements regular and to prevent constipation.     Wound Care: 1. Keep clean and dry.  Shower daily.   Reasons to call the Doctor: Fever - Oral temperature greater than 100.4 degrees Fahrenheit Foul-smelling vaginal discharge Difficulty urinating Nausea and vomiting Increased pain at the site of the incision that is unrelieved with pain medicine. Difficulty breathing with or without chest pain New calf pain especially if only on one side Sudden, continuing increased vaginal bleeding with or without clots.   Contacts: For questions or concerns you should contact:   Dr. Clide Cliff at [phone]   Warner Mccreedy, NP at [phone]   After Hours: call [phone] and have the GYN Oncologist paged/contacted (after 5 pm or on the weekends). You will speak with an after hours RN and let he or she know you have had surgery.   Messages sent via mychart are for non-urgent  matters and are not responded to after hours so for urgent needs, please call the after hours number.

## 2023-07-05 NOTE — H&P (Signed)
 Brief Pre-operative History & Physical  Patient name: Tammy Vargas CSN: [id number] MRN: [id number] Admit Date: 07/05/2023 Date of Surgery: 07/05/2023 Performing Service: Gynecology   Code Status: Full Code    Assessment & Plan    Tammy Vargas is a 43 y.o. female with ADNEXAL MASS, who presents for: Procedure(s) (LRB): XI ROBOTIC ASSISTED LAPAROSCOPIC HYSTERECTOMY AND SALPINGECTOMY AND POSSIBLE BILATERAL OOPHORECTOMY (Bilateral) POSSIBLE LAPAROTOMY WITH POSSIBLE STAGING (N/A).   Consent obtained in office is accurate. Risks, benefits, and alternatives to surgery were reviewed, and all questions were answered.  Proceed to the OR as planned.     History of Present Illness:  Tammy Vargas is a 43 y.o. 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 She was recently seen in clinic, where a detailed HPI can be found. She was noted to benefit from: Procedure(s) (LRB): XI ROBOTIC ASSISTED LAPAROSCOPIC HYSTERECTOMY AND SALPINGECTOMY AND POSSIBLE BILATERAL OOPHORECTOMY (Bilateral) POSSIBLE LAPAROTOMY WITH POSSIBLE STAGING (N/A).   Medical History Past Medical History:  Diagnosis Date   Hyperlipidemia    Hypertension 01/04/2022   Surgical History History reviewed. No pertinent surgical history. Allergies Patient has no known allergies.  Medications   Current Facility-Administered Medications  Medication Dose Route Frequency Provider Last Rate Last Admin   ceFAZolin (ANCEF) IVPB 2g/100 mL premix  2 g Intravenous On Call to OR Cross, Melissa D, NP       dexamethasone (DECADRON) injection 4 mg  4 mg Intravenous On Call to OR Cross, Laurene Footman, NP       lactated ringers infusion   Intravenous Continuous Marcene Duos, MD       metroNIDAZOLE (FLAGYL) IVPB 500 mg  500 mg Intravenous On Call to OR Cross, Efraim Kaufmann D, NP       scopolamine (TRANSDERM-SCOP) 1 MG/3DAYS 1.5 mg  1 patch Transdermal On Call to OR Warner Mccreedy D, NP   1.5 mg at 07/05/23 1012    Vital Signs BP (!) 148/97 (BP Location: Right Arm)    Pulse 84   Temp 98.2 F (36.8 C) (Oral)   Resp 16   Ht 5\' 2"  (1.575 m)   Wt 163 lb (73.9 kg)   SpO2 98%   BMI 29.81 kg/m  Facility age limit for growth %iles is 20 years. Facility age limit for growth %iles is 20 years..   Physical Exam General: Well developed, appears stated age, in no acute distress  Mental status: Alert and oriented x3 Cardiovascular: Normal Pulmonary: Symmetric chest rise, unlabored breathing Relevant System for Surgery: Surgical site examination deferred to the OR   Labs and Studies: Lab Results  Component Value Date   WBC 8.0 06/22/2023   HGB 12.5 06/22/2023   HCT 38.1 06/22/2023   PLT 300 06/22/2023    No results found for: "INR", "APTT" \

## 2023-07-05 NOTE — Transfer of Care (Signed)
 Immediate Anesthesia Transfer of Care Note  Patient: Tammy Vargas  Procedure(s) Performed: ROBOTIC ASSISTED TOTAL LAPAROSCOPIC HYSTERECTOMY, BILATERAL SAPLINGO-OOPHORECTOMY, BILATERAL URETEROLYSIS (Bilateral)  Patient Location: PACU  Anesthesia Type:General  Level of Consciousness: drowsy  Airway & Oxygen Therapy: Patient Spontanous Breathing and Patient connected to nasal cannula oxygen  Post-op Assessment: Report given to RN and Post -op Vital signs reviewed and stable  Post vital signs: Reviewed and stable  Last Vitals:  Vitals Value Taken Time  BP 145/95 07/05/23 1415  Temp 36.8 C 07/05/23 1410  Pulse 100 07/05/23 1418  Resp 16 07/05/23 1418  SpO2 98 % 07/05/23 1418  Vitals shown include unfiled device data.  Last Pain:  Vitals:   07/05/23 1415  TempSrc:   PainSc: 10-Worst pain ever         Complications: No notable events documented.

## 2023-07-05 NOTE — Anesthesia Postprocedure Evaluation (Signed)
 Anesthesia Post Note  Patient: Tammy Vargas  Procedure(s) Performed: ROBOTIC ASSISTED TOTAL LAPAROSCOPIC HYSTERECTOMY, BILATERAL SAPLINGO-OOPHORECTOMY, BILATERAL URETEROLYSIS (Bilateral)     Anesthesia Type: General Anesthetic complications: no   No notable events documented.  Last Vitals:  Vitals:   07/05/23 1515 07/05/23 1530  BP: 133/86 127/84  Pulse: (!) 107 (!) 107  Resp: 16 13  Temp: 36.5 C   SpO2: 96% 99%    Last Pain:  Vitals:   07/05/23 1530  TempSrc:   PainSc: 5                  Lewie Loron

## 2023-07-05 NOTE — Op Note (Signed)
 GYNECOLOGIC ONCOLOGY OPERATIVE NOTE  Date of Service: 07/05/2023  Preoperative Diagnosis: Bilateral complex adnexal masses, elevated tumor marker, uterine fibroids, abnormal uterine bleeding, chronic pelvic pain  Postoperative Diagnosis: Bilateral ovarian masses, right hydrosalpinx, retroperitoneal fibrosis,  uterine fibroids, abnormal uterine bleeding, chronic pelvic pain  Procedures: Robotic-assisted total laparoscopic hysterectomy, bilateral salpingo-oophorectomy, bilateral ureterolysis (Modifer 22: increased duration of the procedure by >72min due to complexity due adhesive disease requiring extensive meticulous lysis of adhesions, necessitating additional instrumentation for retraction and safe exposure)  Surgeon: Clide Cliff, MD  Assistants: Antionette Char, MD and (an MD assistant was necessary for tissue manipulation, management of robotic instrumentation, retraction and positioning due to the complexity of the case and hospital policies)  Anesthesia: General  Estimated Blood Loss: 150 mL    Fluids: 1000 ml, crystalloid  Urine Output: 300 ml, clear yellow  Findings: On entry to abdomen, upper abdominal survey with adhesions of the liver to the anterior abdominal wall bilaterally.  Otherwise normal-appearing liver, diaphragm, stomach, omentum and bowel.  In pelvis uterus is deviated anteriorly due to bilateral complex cystic masses filling the posterior cul-de-sac.  Masses are a left cystic ovarian mass, right cystic ovarian mass, and right hydrosalpinx.  Adnexal masses were adherent to the bilateral pelvic sidewalls and posterior uterus. Uterus mildly enlarged with intramural fibroids.  Retroperitoneal fibrosis requiring ureterolysis bilaterally from the pelvic brim to insertion into the bladder.  Unavoidable cyst rupture of bilateral cystic masses during dissection with clear cyst fluid drained.  IOFS benign cysts.   Specimens:  ID Type Source Tests Collected by Time  Destination  1 : Bilateral tubes and ovaries Tissue PATH Gyn tumor resection SURGICAL PATHOLOGY Clide Cliff, MD 07/05/2023 1302   2 : Uterus and cervix Tissue PATH Gyn tumor resection SURGICAL PATHOLOGY Clide Cliff, MD 07/05/2023 1310   A : Pelvic Washing Body Fluid PATH Cytology Pelvic Washing CYTOLOGY - NON PAP Clide Cliff, MD 07/05/2023 1142     Complications:  None  Indications for Procedure: Tammy Vargas is a 43 y.o. woman with bilateral complex adnexal masses and elevated HE4.  She additionally has been experiencing chronic pelvic pain, abnormal uterine bleeding and uterine fibroids for which she desires concurrent hysterectomy.  Prior to the procedure, all risks, benefits, and alternatives were discussed and informed surgical consent was signed.  Procedure: Patient was taken to the operating room where general anesthesia was achieved.  She was positioned in dorsal lithotomy and prepped and draped.  A foley catheter was inserted into the bladder. The cervix was dilated and an Advincula uterine manipulator with a colpotomy ring was inserted into the uterus.  A 12 mm incision was made in the left upper quadrant near Palmer's point.  The abdomen was entered with a 5 mm OptiView trocar under direct visualization.  The abdomen was insufflated, the patient placed in steep Trendelenburg, and additional trocars were placed as follows: an 8mm trocar superior to the umbilicus, one 8 mm robotic trocar in the right abdomen, and one 8 mm robotic trocar in the left abdomen.  The left upper quadrant trocar was removed and replaced with a 12 mm airseal trocar.  All trocars were placed under direct visualization.  The bowels were moved into the upper abdomen.  The DaVinci robotic surgical system was brought to the patient's bedside and docked.  Pelvic washings were collected.  Given that bilateral ovarian masses were filling the posterior cul-de-sac limiting view in the pelvis, decision made to  proceed with initial BSO to optimize visualization.  The left round ligament was transected and the retroperitoneum entered.  The left ureter was identified.  The left infundibulopelvic ligament was isolated, cauterized, and transected.  Next the proximal left fallopian tube and utero-ovarian ligament were isolated, cauterized, and transected.  Additional peritoneal attachments of the mass were dissected from the left pelvic sidewall and the posterior uterus.  During this dissection it was noted that the left ureter was coming into close proximity to the pelvic mass adhesions.  Ureterolysis was performed to mobilize the ureter laterally, and the remaining adhesions of the left adnexal mass to the left pelvic sidewall were lysed.  During this dissection, rupture of the cyst occurred with clear cyst fluid drained.  The left adnexa was then placed in the upper abdomen for later retrieval.  A similar procedure was performed on the right and the right adnexa was placed in the upper abdomen for later retrieval.  The posterior peritoneum was opened to the KOH ring on the left.  The anterior peritoneum was opened and the bladder flap was initiated.  Attention was turned to the right and this was repeated.  Given the close proximity of the ureters bilaterally to the KOH ring, additional ureterolysis was performed bilaterally to the level of insertion into the bladder.  Then the right uterine artery was skeletonized, cauterized, and transected at the level of the KOH ring.  Additional cautery was used in a C-shaped fashion to allow the remainder of the broad, cardinal, and uterosacral ligaments with the uterine vessels to be transected and fall away from the KOH ring.  A similar procedure was performed on the left side.  A colpotomy was made circumferentially following the contours of the KOH ring.  The manipulator was removed.  The bilateral Nexa were placed in an Endo Catch bag and removed through the vagina.  Next the  uterus and cervix was placed in an Endo Catch bag and removed through the vagina. IOFS of the adnexa returned benign.  The vaginal cuff was closed with a running stitch of 0 Vicryl suture.  The pelvis was irrigated and all operative sites were found to be hemostatic.  The patient was taken out of Trendelenburg to suction out the remainder of cyst fluid that had pulled into the upper abdomen.  All instruments were removed and the robot was taken from the patient's bedside. The fascia at the 12 mm incision was closed with 0 Vicryl using a PMI device. The abdomen was desufflated and all ports were removed. The skin at all incisions was closed with 4-0 Vicryl to reapproximate the subcutaneous tissue and 4-0 monocryl in a subcuticular fashion followed by surgical glue.  Patient tolerated the procedure well. Sponge, lap, and instrument counts were correct.  Patient received 2 gm of Ancef and 500mg  of Metronidazole prior to skin incision for routine perioperative antibiotic prophylaxis.  She was extubated and taken to the PACU in stable condition.  Clide Cliff, MD Gynecologic Oncology

## 2023-07-05 NOTE — Anesthesia Procedure Notes (Signed)
 Procedure Name: Intubation Date/Time: 07/05/2023 11:04 AM  Performed by: Maurene Capes, CRNAPre-anesthesia Checklist: Patient identified, Emergency Drugs available, Suction available and Patient being monitored Patient Re-evaluated:Patient Re-evaluated prior to induction Oxygen Delivery Method: Circle System Utilized Preoxygenation: Pre-oxygenation with 100% oxygen Induction Type: IV induction Ventilation: Mask ventilation without difficulty Tube type: Oral Tube size: 7.0 mm Number of attempts: 1 Placement Confirmation: ETT inserted through vocal cords under direct vision, positive ETCO2 and breath sounds checked- equal and bilateral Secured at: 21 cm Tube secured with: Tape Dental Injury: Teeth and Oropharynx as per pre-operative assessment

## 2023-07-06 ENCOUNTER — Telehealth: Payer: Self-pay | Admitting: *Deleted

## 2023-07-06 ENCOUNTER — Encounter (HOSPITAL_COMMUNITY): Payer: Self-pay | Admitting: Psychiatry

## 2023-07-06 NOTE — Telephone Encounter (Signed)
 Spoke with Ms. Rayburn this morning. She states she is eating, drinking and urinating well. She has not had a BM yet and Not passing gas. She is taking senokot as prescribed and encouraged her to drink plenty of water. She denies fever or chills. Incisions are dry and intact. She rates her pain 6/10. Her pain is controlled with tylenol. Encouraged ambulation, hot liquids, over the counter mylicon chews, and heating pad. Pt states she has a heating and will try that. Advised pt the office would call her tomorrow to check in.     Instructed to call office with any fever, chills, purulent drainage, uncontrolled pain or any other questions or concerns. Patient verbalizes understanding.   Pt aware of post op appointments as well as the office number [phone] and after hours number [phone] to call if she has any questions or concerns

## 2023-07-07 LAB — CYTOLOGY - NON PAP

## 2023-07-07 NOTE — Telephone Encounter (Signed)
 Spoke with Ms. Kanouse. Pt states she is still not passing gas, but denies abdominal distention, denies gas pain, nausea and or vomiting. Pt hasn't had a bowel movement yet, continues to take senokot and is keeping herself well hydrated. Patient states she is ambulating and hasn't tried the over the counter mylicon chews and is not drinking hot liquids. Advised patient if she hasn't had a bowel movement by tomorrow she can add miralax 1 capful twice daily and stop if stools become loose and add back in according to the consistency of her stool. Pt verbalized understanding and thanked the office for calling.

## 2023-07-08 LAB — SURGICAL PATHOLOGY

## 2023-07-11 ENCOUNTER — Encounter: Payer: Self-pay | Admitting: Psychiatry

## 2023-07-11 NOTE — Telephone Encounter (Signed)
 Spoke with Ms. Roehrs who states she is passing gas and having regular bowel movements. Advised patient to call the office with any concerns. Pt thanked the office for calling.

## 2023-07-21 ENCOUNTER — Encounter: Payer: Self-pay | Admitting: Physician Assistant

## 2023-07-21 ENCOUNTER — Telehealth

## 2023-07-21 DIAGNOSIS — F17213 Nicotine dependence, cigarettes, with withdrawal: Secondary | ICD-10-CM | POA: Diagnosis not present

## 2023-07-21 DIAGNOSIS — Z716 Tobacco abuse counseling: Secondary | ICD-10-CM

## 2023-07-21 MED ORDER — NICOTINE 21 MG/24HR TD PT24: 21.0000 mg | MEDICATED_PATCH | Freq: Every day | TRANSDERMAL | 0 refills | Status: AC

## 2023-07-21 MED ORDER — NICOTINE POLACRILEX 4 MG MT GUM: CHEWING_GUM | OROMUCOSAL | 0 refills | Status: DC

## 2023-07-21 NOTE — Patient Instructions (Signed)
 Tammy Vargas, thank you for joining [name], PA-C for today's virtual visit.  While this provider is not your primary care provider (PCP), if your PCP is located in our provider database this encounter information will be shared with them immediately following your visit.  Consent: (Patient) Tammy Vargas provided verbal consent for this virtual visit at the beginning of the encounter.  Current Medications:  Current Outpatient Medications:  .  nicotine (NICODERM CQ - DOSED IN MG/24 HOURS) 21 mg/24hr patch, Place 1 patch (21 mg total) onto the skin daily., Disp: 30 patch, Rfl: 0 .  nicotine polacrilex (NICORETTE) 4 MG gum, Weeks 1 to 6: Chew 1 piece of gum every 1 to 2 hours (maximum: 24 pieces/day); to increase chances of quitting, chew at least 9 pieces/day during the first 6 weeks., Disp: 100 each, Rfl: 0 .  amLODipine (NORVASC) 10 MG tablet, Take 1 tablet (10 mg total) by mouth daily. (Patient taking differently: Take 10 mg by mouth daily. PT restarted on 06/22/23 and taking daily in the am per pt on 06/23/23.), Disp: 30 tablet, Rfl: 3 .  atorvastatin (LIPITOR) 20 MG tablet, Take 1 tablet (20 mg total) by mouth daily. (Patient not taking: Reported on 06/16/2023), Disp: 30 tablet, Rfl: 3 .  chlorthalidone (HYGROTON) 25 MG tablet, Take 1 tablet (25 mg total) by mouth daily. (Patient not taking: Reported on 06/16/2023), Disp: 90 tablet, Rfl: 1 .  estradiol (CLIMARA) 0.1 mg/24hr patch, Place 1 patch (0.1 mg total) onto the skin once a week., Disp: 4 patch, Rfl: 12 .  traMADol (ULTRAM) 50 MG tablet, Take 1 tablet (50 mg total) by mouth every 6 (six) hours as needed for severe pain (pain score 7-10). For AFTER surgery only, do not take and drive, Disp: 15 tablet, Rfl: 0   When you are getting low on your smoking cessation medications, schedule your next video appointment as previously discussed. This way we can follow-up with you, make any necessary adjustments/changes, and get next fill of  medication sent in to your pharmacy.   Follow-Up: Call back or seek an in-person evaluation if the symptoms worsen or if the condition fails to improve as anticipated.  Other Instructions  Congratulations for your interest in quitting smoking!  Quitting smoking is one of the most important things you can do to protect your health.  We are here to help you! Did you know that if you quit smoking 1 pack per day you could save up to $2550.00 per year?  Medications are not appropriate for everyone depending upon your situation and any health issues you may have. If we do prescribe medication, it will be for 1 month at time and you will be required to have a follow up Video visit at 1 month to assess how you are doing and if there are any side effects.  This could be for up to a total of 3 months.    Support from your friends, family and work colleagues is very important. Please let them know that you are trying to stop smoking so that they understand your need for support in this goal!  - Remove tobacco products from your environment - Set a quit date ideally within 2 weeks. - You should totally abstain from smoking after your quit date. A single puff could hurt your progress or cause you to relapse - If there are others in your household that smoke, ask them to try to quit or abstain from smoking in your presence  You may notice nicotine withdrawal symptoms such as increased appetite and weight gain, changes in mood, insomnia, irritability and/or anxiety. These symptoms peak in the first three days after smoking cessation and subside over the next 3-4 weeks.   I have prescribed Nicotine patches.  The patient is to apply the patch to a non-hairy skin site and rotate the site daily. Will begin with starting dose of Because you were smoking > 10 cigarettes per day, I have prescribed a 21 mg patch for you to apply daily for 6 weeks. Then we will plan to decrease to a 14 mg patch daily for 2 weeks,  followed by a 7 mg patch daily for 2 more weeks.  We will discuss again in detail at your follow-up visit! and I have prescribed Nicotine Gum with the following directions: I have sent in a 4 mg dose for you to take as follows:. Weeks 1 to 6: Chew 1 piece of gum every 1 to 2 hours (maximum: 24 pieces/day); to increase chances of quitting, chew at least 9 pieces/day during the first 6 weeks.  Weeks 7 to 9: Chew 1 piece of gum every 2 to 4 hours (maximum: 24 pieces/day). Weeks 10 to 12: Chew 1 piece of gum every 4 to 8 hours (maximum: 24 pieces/day).   Chew the gum slowly - using the "chew" and "park" method. Chew the gum until the nicotine taste appears, then "park" the gum until the taste disappears, then chew again to release more nicotine. Chew the gum for 30 minutes.  A combination of behavioral and medication can improve the success of you quitting smoking. You may want to use the 1-800-QUIT-NOW free support line.  Also, the Department of Health and Human Services provides Smoke free apps for smartphones: SharedCustomer.fi  If you happen to break your plan and have a cigarette, keep taking your medications and continue to try to abstain.  Do not give up!  MAKE SURE YOU  Take any prescribed medications only as instructed.  If you miss a dose of medication, take the next dose when it is due and get back on schedule. DO NOT double up on medications.  Mark your calendar to do your Follow Up Smoking Cessation Visit in one month   If you have been instructed to have an in-person evaluation today at a local Urgent Care facility, please use the link below. It will take you to a list of all of our available [facility], including address, phone number and hours of operation. Please do not delay care.  [facility]  If you or a family member do not have a primary care provider, use the link below to schedule a visit and establish care. When you choose a [facility] primary care physician or advanced practice provider, you gain a long-term partner in health. Find a Primary Care Provider  Learn more about [facility]'s in-office and virtual care options:  - Get Care Now

## 2023-07-21 NOTE — Progress Notes (Signed)
 Virtual Visit Consent   Tammy Vargas, you are scheduled for a virtual visit with a [facility] provider today. Just as with appointments in the office, your consent must be obtained to participate. Your consent will be active for this visit and any virtual visit you may have with one of our providers in the next 365 days. If you have a MyChart account, a copy of this consent can be sent to you electronically.  As this is a virtual visit, video technology does not allow for your provider to perform a traditional examination. This may limit your provider's ability to fully assess your condition. If your provider identifies any concerns that need to be evaluated in person or the need to arrange testing (such as labs, EKG, etc.), we will make arrangements to do so. Although advances in technology are sophisticated, we cannot ensure that it will always work on either your end or our end. If the connection with a video visit is poor, the visit may have to be switched to a telephone visit. With either a video or telephone visit, we are not always able to ensure that we have a secure connection.  By engaging in this virtual visit, you consent to the provision of healthcare and authorize for your insurance to be billed (if applicable) for the services provided during this visit. Depending on your insurance coverage, you may receive a charge related to this service.  I need to obtain your verbal consent now. Are you willing to proceed with your visit today? Tammy Vargas has provided verbal consent on 07/21/2023 for a virtual visit (video or telephone). Tammy Vargas, [place]  Date: 07/21/2023 10:18 AM  Virtual Visit via Video Note   I, Tammy Vargas, connected with  Tammy Vargas  ([id number], 1980/11/29) on 07/21/23 at 10:00 AM EDT by a video-enabled telemedicine application and verified that I am speaking with the correct person using two identifiers.  Location: Patient: Virtual Visit Location  Patient: Home Provider: Virtual Visit Location Provider: Home Office   I discussed the limitations of evaluation and management by telemedicine and the availability of in person appointments. The patient expressed understanding and agreed to proceed.    History of Present Illness: Tammy Vargas is wanting to discuss their tobacco use and is seeking assistance with cessation. Patient has been smoking/using around 3 packs per day of cigarettes per day for the past 25 years. There has been prior attempts to quit without success -- usually cold [place].  Does currently drink alcohol, weekends only, beer -- 6-10.  Prior marijuana use. None at present. Denies other substance use.   Flowsheet Row Video Visit from 07/21/2023 in [facility]  PHQ-2 Total Score 0      Flowsheet Row Video Visit from 07/21/2023 in [facility]  AUDIT-C Score 6      Is currently on a 14 mg  Nicoderm patch from her PCP. States she never started the higher dose before initiating use of current patches. Notes tolerating patch well with slight decrease in cigarette use but still smoking heavily.    Triggers: The following trigger(s) for use has/have been identified: psychological: after meals, drinking alcohol, caffeine, house. Does not smoke in car.  Withdrawal Symptoms: Identified withdrawal symptoms: irritable mood.  State of Readiness: Patient feels overall Ready to quit. Has good support system. Her BF is a smoker but is also trying to quit as well.  Concerns/Barriers to Cessation - Withdrawal symptoms  Observations/Objective: Patient is well-developed, well-nourished in no  acute distress.  Resting comfortably at home.  Head is normocephalic, atraumatic.  No labored breathing. Speech is clear and coherent with logical content.  Patient is alert and oriented at baseline.   Assessment and Plan: 1. Nicotine dependence, cigarettes, with withdrawal - Patient is currently at the following State  of Change: Action -- involved in a quit attempt - Comorbid conditions identified: alcohol use, hypertension (controlled). - Reviewed impacts of smoking on patient's health. - Quit date set for first date of using 21 mg patch. - I have prescribed Nicotine patches.  The patient is to apply the patch to a non-hairy skin site and rotate the site daily. Will begin with starting dose of Because you were smoking > 10 cigarettes per day, I have prescribed a 21 mg patch for you to apply daily for 6 weeks. Then we will plan to decrease to a 14 mg patch daily for 2 weeks, followed by a 7 mg patch daily for 2 more weeks.  We will discuss again in detail at your follow-up visit! and I have prescribed Nicotine Gum with the following directions: I have sent in a 4 mg dose for you to take as follows: Weeks 1 to 6: Chew 1 piece of gum every 1 to 2 hours (maximum: 24 pieces/day); to increase chances of quitting, chew at least 9 pieces/day during the first 6 weeks.  Weeks 7 to 9: Chew 1 piece of gum every 2 to 4 hours (maximum: 24 pieces/day). Weeks 10 to 12: Chew 1 piece of gum every 4 to 8 hours (maximum: 24 pieces/day).   Chew the gum slowly - using the "chew" and "park" method. Chew the gum until the nicotine taste appears, then "park" the gum until the taste disappears, then chew again to release more nicotine. Chew the gum for 30 minutes. - Other resources including the [employer] and Department of Health and [employer] -- have been included in the patient's written instructions and sent to their MyChart. - Monitor alcohol use -- if any increase, will work with PCP for referral for treatment.  - Close monitoring for BP at subsequent PCP visits.  - Plan for follow-up in 1 week.  Follow Up Instructions: I discussed the assessment and treatment plan with the patient. The patient was provided an opportunity to ask questions and all were answered. The patient agreed with the plan and demonstrated an  understanding of the instructions.  A copy of instructions were sent to the patient via MyChart unless otherwise noted below.   Time:  I have spent 30 minutes with the patient via telehealth technology in tobacco cessation counseling.    Tammy Climes, PA-C

## 2023-07-23 DIAGNOSIS — Z419 Encounter for procedure for purposes other than remedying health state, unspecified: Secondary | ICD-10-CM | POA: Diagnosis not present

## 2023-07-25 ENCOUNTER — Encounter: Payer: Self-pay | Admitting: Psychiatry

## 2023-07-25 ENCOUNTER — Inpatient Hospital Stay: Payer: Medicaid Other | Attending: Psychiatry | Admitting: Psychiatry

## 2023-07-25 ENCOUNTER — Encounter: Payer: Self-pay | Admitting: Gynecologic Oncology

## 2023-07-25 VITALS — BP 138/84 | HR 86 | Temp 99.0°F | Resp 20 | Ht 62.0 in | Wt 163.0 lb

## 2023-07-25 DIAGNOSIS — Z90710 Acquired absence of both cervix and uterus: Secondary | ICD-10-CM

## 2023-07-25 DIAGNOSIS — D271 Benign neoplasm of left ovary: Secondary | ICD-10-CM

## 2023-07-25 DIAGNOSIS — D270 Benign neoplasm of right ovary: Secondary | ICD-10-CM

## 2023-07-25 DIAGNOSIS — Z9079 Acquired absence of other genital organ(s): Secondary | ICD-10-CM

## 2023-07-25 DIAGNOSIS — Z90722 Acquired absence of ovaries, bilateral: Secondary | ICD-10-CM

## 2023-07-25 DIAGNOSIS — D219 Benign neoplasm of connective and other soft tissue, unspecified: Secondary | ICD-10-CM

## 2023-07-25 DIAGNOSIS — N9489 Other specified conditions associated with female genital organs and menstrual cycle: Secondary | ICD-10-CM

## 2023-07-25 NOTE — Progress Notes (Signed)
 Gynecologic Oncology Return Clinic Visit  Date of Service: 07/25/2023 Referring Provider: Kiki Pelton, MD   Assessment & Plan: Tammy Vargas is a 43 y.o. woman with bilateral complex adnexal masses, elevated tumor marker, uterine fibroids, AUB, chronic pelvic pain who is s/p RA-TLH, BSO, blt ureterolysis on 07/05/23, benign final pathology.  Postop: - Pt recovering well from surgery and healing appropriately postoperatively - Intraoperative findings and pathology results reviewed: benign. - Ongoing postoperative expectations and precautions reviewed. Continue with no lifting >10lbs through 6 weeks postoperatively - Pt works in [employer], house cleaning. Okay to return to work at 6 weeks, but will have pt follow-up prior to ensure ready for return. - Follow-up in 3 weeks for re-eval to ensure improvement in back pain, pelvic pain.  Vaginal itching - No abnormality on exam, no discharge - Recommend trying benadryl as could be a reaction to prep - If no improvement could try a dose of diflucan   RTC 3 weeks.  Derrel Flies, MD Gynecologic Oncology    ----------------------- Reason for Visit: Postop  Interval History: Pt reports that she is recovering well from surgery. She is using tylenol for pain. Pain is primarily in lower back and pelvis. She is eating and drinking but with decreased appetite. She is voiding without issue and having regular bowel movements. No bleeding. Using climara patch. Having hotflahes.    Past Medical/Surgical History: Past Medical History:  Diagnosis Date   Hyperlipidemia    Hypertension 01/04/2022    Past Surgical History:  Procedure Laterality Date   HYSTERECTOMY,TOTAL,BILAT SALPINGO-OOPHORECTOMY, ROBOT, LAP Bilateral 07/05/2023   Procedure: ROBOTIC ASSISTED TOTAL LAPAROSCOPIC HYSTERECTOMY, BILATERAL SAPLINGO-OOPHORECTOMY, BILATERAL URETEROLYSIS;  Surgeon: Derrel Flies, MD;  Location: [facility];  Service: Gynecology;  Laterality: Bilateral;     Family History  Problem Relation Age of Onset   Hypertension Mother    Hypertension Father    Breast cancer Maternal Aunt    Pancreatic cancer Maternal Uncle    Colon cancer Paternal Uncle    Ovarian cancer Neg Hx    Endometrial cancer Neg Hx    Prostate cancer Neg Hx     Social History   Socioeconomic History   Marital status: Single    Spouse name: Not on file   Number of children: Not on file   Years of education: Not on file   Highest education level: Not on file  Occupational History   Not on file  Tobacco Use   Smoking status: Every Day    Current packs/day: 3.00    Average packs/day: 2.0 packs/day for 24.3 years (48.1 ttl pk-yrs)    Types: Cigarettes    Start date: 2001   Smokeless tobacco: Never  Vaping Use   Vaping status: Never Used  Substance and Sexual Activity   Alcohol use: Yes    Comment: occ   Drug use: Not Currently    Types: Marijuana    Comment: on occas   Sexual activity: Not Currently    Partners: Male    Birth control/protection: None  Other Topics Concern   Not on file  Social History Narrative   Not on file   Social Drivers of Health   Financial Resource Strain: Not on file  Food Insecurity: Food Insecurity Present (05/30/2023)   Hunger Vital Sign    Worried About Running Out of Food in the Last Year: Sometimes true    Ran Out of Food in the Last Year: Often true  Transportation Needs: No Transportation Needs (05/05/2023)   PRAPARE -  Administrator, Civil Service (Medical): No    Lack of Transportation (Non-Medical): No  Physical Activity: Not on file  Stress: Not on file  Social Connections: Not on file    Current Medications:  Current Outpatient Medications:    amLODipine (NORVASC) 10 MG tablet, Take 1 tablet (10 mg total) by mouth daily. (Patient taking differently: Take 10 mg by mouth daily. PT restarted on 06/22/23 and taking daily in the am per pt on 06/23/23.), Disp: 30 tablet, Rfl: 3   estradiol (CLIMARA)  0.1 mg/24hr patch, Place 1 patch (0.1 mg total) onto the skin once a week., Disp: 4 patch, Rfl: 12   traMADol (ULTRAM) 50 MG tablet, Take 1 tablet (50 mg total) by mouth every 6 (six) hours as needed for severe pain (pain score 7-10). For AFTER surgery only, do not take and drive, Disp: 15 tablet, Rfl: 0   atorvastatin (LIPITOR) 20 MG tablet, Take 1 tablet (20 mg total) by mouth daily. (Patient not taking: Reported on 06/16/2023), Disp: 30 tablet, Rfl: 3   chlorthalidone (HYGROTON) 25 MG tablet, Take 1 tablet (25 mg total) by mouth daily. (Patient not taking: Reported on 06/16/2023), Disp: 90 tablet, Rfl: 1   nicotine (NICODERM CQ - DOSED IN MG/24 HOURS) 21 mg/24hr patch, Place 1 patch (21 mg total) onto the skin daily., Disp: 30 patch, Rfl: 0   nicotine polacrilex (NICORETTE) 4 MG gum, Weeks 1 to 6: Chew 1 piece of gum every 1 to 2 hours (maximum: 24 pieces/day); to increase chances of quitting, chew at least 9 pieces/day during the first 6 weeks., Disp: 100 each, Rfl: 0  Review of Symptoms: Complete 10-system review is positive for: Joint pain, back pain, hot flashes, itch  Physical Exam: BP (!) 151/95 (BP Location: Left Arm, Patient Position: Sitting)   Pulse 86   Temp 99 F (37.2 C) (Oral)   Resp 20   Ht 5\' 2"  (1.575 m)   Wt 163 lb (73.9 kg)   BMI 29.81 kg/m  General: Alert, oriented, no acute distress. HEENT: Normocephalic, atraumatic. Neck symmetric without masses. Sclera anicteric.  Chest: Normal work of breathing. Clear to auscultation bilaterally.   Cardiovascular: Regular rate and rhythm, no murmurs. Abdomen: Soft, nontender.  Normoactive bowel sounds.  No masses appreciated.  Well-healing incisions. Extremities: Grossly normal range of motion.  Warm, well perfused.  No edema bilaterally. Skin: No rashes or lesions noted. GU: Normal appearing external genitalia without erythema, excoriation, or lesions.  Speculum exam reveals well healing vaginal cuff, intact.  Bimanual exam reveals  intact vaginal cuff. Exam chaperoned by Lutricia Salts, RN   Laboratory & Radiologic Studies: Surgical pathology (07/05/23): A. FALLOPIAN TUBES AND OVARIES, BILATERAL, RESECTION:  - Ovary: Benign serous cystadenoma, benign corpus luteal cyst  - Bilateral fallopian tubes with no significant pathologic changes   B. UTERUS AND CERVIX, HYSTERECTOMY:  - Cervix: No significant pathologic changes  - Endometrium: Benign, no atypia, hyperplasia or malignancy identified  - Myometrium: Leiomyomata   Cytology: FINAL MICROSCOPIC DIAGNOSIS:  - No malignant cells identified

## 2023-07-25 NOTE — Patient Instructions (Addendum)
 It was a pleasure to see you in clinic today. - You can take 600mg  ibuprofen (three 200mg  tablets) every 6 hours as needed. You can try a dose of benadryl to see if helps with vaginal itching. - Return visit planned for 3 weeks.  Thank you very much for allowing me to provide care for you today.  I appreciate your confidence in choosing our Gynecologic Oncology team at [facility].  If you have any questions about your visit today please call our office or send us  a MyChart message and we will get back to you as soon as possible.

## 2023-07-27 ENCOUNTER — Ambulatory Visit: Attending: Family Medicine | Admitting: Family Medicine

## 2023-07-27 ENCOUNTER — Encounter: Payer: Self-pay | Admitting: Family Medicine

## 2023-07-27 VITALS — BP 127/88 | HR 82 | Wt 166.2 lb

## 2023-07-27 DIAGNOSIS — F17200 Nicotine dependence, unspecified, uncomplicated: Secondary | ICD-10-CM | POA: Insufficient documentation

## 2023-07-27 DIAGNOSIS — E7800 Pure hypercholesterolemia, unspecified: Secondary | ICD-10-CM | POA: Diagnosis not present

## 2023-07-27 DIAGNOSIS — Z90710 Acquired absence of both cervix and uterus: Secondary | ICD-10-CM | POA: Diagnosis not present

## 2023-07-27 DIAGNOSIS — I10 Essential (primary) hypertension: Secondary | ICD-10-CM

## 2023-07-27 DIAGNOSIS — F17210 Nicotine dependence, cigarettes, uncomplicated: Secondary | ICD-10-CM

## 2023-07-27 MED ORDER — CHLORTHALIDONE 25 MG PO TABS: 25.0000 mg | ORAL_TABLET | Freq: Every day | ORAL | 1 refills | Status: DC

## 2023-07-27 MED ORDER — ATORVASTATIN CALCIUM 20 MG PO TABS: 20.0000 mg | ORAL_TABLET | Freq: Every day | ORAL | 1 refills | Status: DC

## 2023-07-27 MED ORDER — AMLODIPINE BESYLATE 10 MG PO TABS: 10.0000 mg | ORAL_TABLET | Freq: Every day | ORAL | 1 refills | Status: DC

## 2023-07-27 NOTE — Patient Instructions (Signed)
 VISIT SUMMARY:  You came in today for a post-operative follow-up and medication refills. You are feeling well after your recent hysterectomy, and you are pleased that the mass was not cancerous. You mentioned using various patches for smoking cessation and menopausal symptoms, and you have noticed an decreased appetite since the surgery. We also discussed your hypertension and hyperlipidemia management.  YOUR PLAN:  -POSTOPERATIVE CARE FOR HYSTERECTOMY: You might experience early menopause symptoms . This is common after such a surgery. You should follow up with your gynecologist on May 5th and continue your estrogen patches  -HYPERTENSION: Your blood pressure is well-controlled with your current medications, amlodipine and chlorthalidone. We have refilled your prescriptions for these medications.  -HYPERLIPIDEMIA: You have not been taking your cholesterol medication for the past one to two months due to pharmacy issues, and your previous cholesterol levels were high. We have provided you with a 90-day supply of your cholesterol medication. We will check your cholesterol levels at your next visit in three months.  -TOBACCO USE DISORDER: You are ready to quit smoking and are using nicotine patches and gum to help with this. We encourage you to continue using these aids to quit smoking. We also discussed the benefits of getting a pneumonia vaccination due to your smoking history.  INSTRUCTIONS:  Please follow up with your gynecologist on May 5th for your post-operative care. We will check your cholesterol levels at your next visit in three months.

## 2023-07-27 NOTE — Progress Notes (Signed)
 Subjective:  Patient ID: Tammy Vargas, female    DOB: 04-Aug-1980  Age: 43 y.o. MRN: [id number]  CC: Medical Management of Chronic Issues     Discussed the use of AI scribe software for clinical note transcription with the patient, who gave verbal consent to proceed.  History of Present Illness The patient, with a history of hypertension, hyperlipidemia, and recent robotic assisted TLHBLSO, bilateral ureterolysis for uterine fibroids and adnexal mass, pathology was benign who presents for a post-operative follow-up and medication refills.  She reports feeling 'real good' post-operatively and is pleased that the mass was not cancerous. She has been using various patches, including nicotine patches for smoking cessation and a Climara patch for menopausal symptoms following the hysterectomy. She also reports an decreased appetite since the surgery, which she finds unusual.  Her postop visit with her GYN was 2 days ago.   She has been taking amlodipine 10mg  for hypertension and has run out of her cholesterol medication. She has not been taking the cholesterol medication for approximately one to two months due to unavailability at her pharmacy.    Past Medical History:  Diagnosis Date   Hyperlipidemia    Hypertension 01/04/2022    Past Surgical History:  Procedure Laterality Date   HYSTERECTOMY,TOTAL,BILAT SALPINGO-OOPHORECTOMY, ROBOT, LAP Bilateral 07/05/2023   Procedure: ROBOTIC ASSISTED TOTAL LAPAROSCOPIC HYSTERECTOMY, BILATERAL SAPLINGO-OOPHORECTOMY, BILATERAL URETEROLYSIS;  Surgeon: Clide Cliff, MD;  Location: [facility];  Service: Gynecology;  Laterality: Bilateral;    Family History  Problem Relation Age of Onset   Hypertension Mother    Hypertension Father    Breast cancer Maternal Aunt    Pancreatic cancer Maternal Uncle    Colon cancer Paternal Uncle    Ovarian cancer Neg Hx    Endometrial cancer Neg Hx    Prostate cancer Neg Hx     Social History   Socioeconomic  History   Marital status: Single    Spouse name: Not on file   Number of children: Not on file   Years of education: Not on file   Highest education level: Not on file  Occupational History   Not on file  Tobacco Use   Smoking status: Every Day    Current packs/day: 3.00    Average packs/day: 2.0 packs/day for 24.3 years (48.1 ttl pk-yrs)    Types: Cigarettes    Start date: 2001   Smokeless tobacco: Never  Vaping Use   Vaping status: Never Used  Substance and Sexual Activity   Alcohol use: Yes    Comment: occ   Drug use: Not Currently    Types: Marijuana    Comment: on occas   Sexual activity: Not Currently    Partners: Male    Birth control/protection: None  Other Topics Concern   Not on file  Social History Narrative   Not on file   Social Drivers of Health   Financial Resource Strain: Not on file  Food Insecurity: Food Insecurity Present (05/30/2023)   Hunger Vital Sign    Worried About Running Out of Food in the Last Year: Sometimes true    Ran Out of Food in the Last Year: Often true  Transportation Needs: No Transportation Needs (05/05/2023)   PRAPARE - Administrator, Civil Service (Medical): No    Lack of Transportation (Non-Medical): No  Physical Activity: Not on file  Stress: Not on file  Social Connections: Not on file    No Known Allergies  Outpatient Medications Prior to Visit  Medication Sig Dispense Refill   estradiol (CLIMARA) 0.1 mg/24hr patch Place 1 patch (0.1 mg total) onto the skin once a week. 4 patch 12   nicotine (NICODERM CQ - DOSED IN MG/24 HOURS) 21 mg/24hr patch Place 1 patch (21 mg total) onto the skin daily. 30 patch 0   nicotine polacrilex (NICORETTE) 4 MG gum Weeks 1 to 6: Chew 1 piece of gum every 1 to 2 hours (maximum: 24 pieces/day); to increase chances of quitting, chew at least 9 pieces/day during the first 6 weeks. 100 each 0   traMADol (ULTRAM) 50 MG tablet Take 1 tablet (50 mg total) by mouth every 6 (six) hours as  needed for severe pain (pain score 7-10). For AFTER surgery only, do not take and drive 15 tablet 0   amLODipine (NORVASC) 10 MG tablet Take 1 tablet (10 mg total) by mouth daily. (Patient taking differently: Take 10 mg by mouth daily. PT restarted on 06/22/23 and taking daily in the am per pt on 06/23/23.) 30 tablet 3   atorvastatin (LIPITOR) 20 MG tablet Take 1 tablet (20 mg total) by mouth daily. 30 tablet 3   chlorthalidone (HYGROTON) 25 MG tablet Take 1 tablet (25 mg total) by mouth daily. 90 tablet 1   No facility-administered medications prior to visit.     ROS Review of Systems  Constitutional:  Negative for activity change and appetite change.  HENT:  Negative for sinus pressure and sore throat.   Respiratory:  Negative for chest tightness, shortness of breath and wheezing.   Cardiovascular:  Negative for chest pain and palpitations.  Gastrointestinal:  Negative for abdominal distention, abdominal pain and constipation.  Genitourinary: Negative.   Musculoskeletal: Negative.   Psychiatric/Behavioral:  Negative for behavioral problems and dysphoric mood.     Objective:  BP 127/88 (BP Location: Left Arm, Patient Position: Sitting, Cuff Size: Normal)   Pulse 82   Wt 166 lb 3.2 oz (75.4 kg)   SpO2 99%   BMI 30.40 kg/m      07/27/2023    9:38 AM 07/27/2023    9:37 AM 07/25/2023    4:20 PM  BP/Weight  Systolic BP 127  161  Diastolic BP 88  84  Wt. (Lbs)  166.2   BMI  30.4 kg/m2     Wt Readings from Last 3 Encounters:  07/27/23 166 lb 3.2 oz (75.4 kg)  07/25/23 163 lb (73.9 kg)  07/05/23 163 lb (73.9 kg)      Physical Exam Constitutional:      Appearance: She is well-developed.  Cardiovascular:     Rate and Rhythm: Normal rate.     Heart sounds: Normal heart sounds. No murmur heard. Pulmonary:     Effort: Pulmonary effort is normal.     Breath sounds: Normal breath sounds. No wheezing or rales.  Chest:     Chest wall: No tenderness.  Abdominal:     General:  Bowel sounds are normal. There is no distension.     Palpations: Abdomen is soft. There is no mass.     Tenderness: There is no abdominal tenderness.     Comments: Abdominal laparoscopic scars which have healed  Musculoskeletal:        General: Normal range of motion.     Right lower leg: No edema.     Left lower leg: No edema.  Neurological:     Mental Status: She is alert and oriented to person, place, and time.  Psychiatric:  Mood and Affect: Mood normal.        Latest Ref Rng & Units 06/22/2023   11:03 AM 06/22/2023    7:41 AM 05/05/2023    9:50 AM  CMP  Glucose 70 - 99 mg/dL 82  97  95   BUN 6 - 20 mg/dL 8  11  7    Creatinine 0.44 - 1.00 mg/dL 1.32  4.40  1.02   Sodium 135 - 145 mmol/L 137  137  137   Potassium 3.5 - 5.1 mmol/L 4.0  4.1  5.1   Chloride 98 - 111 mmol/L 104  106  102   CO2 22 - 32 mmol/L 24  23  19    Calcium 8.9 - 10.3 mg/dL 8.8  8.7  8.8   Total Protein 6.5 - 8.1 g/dL  7.5    Total Bilirubin 0.0 - 1.2 mg/dL  0.3    Alkaline Phos 38 - 126 U/L  85    AST 15 - 41 U/L  16    ALT 0 - 44 U/L  13      Lipid Panel     Component Value Date/Time   CHOL 231 (H) 12/22/2022 1135   TRIG 64 12/22/2022 1135   HDL 64 12/22/2022 1135   LDLCALC 156 (H) 12/22/2022 1135    CBC    Component Value Date/Time   WBC 8.0 06/22/2023 1103   RBC 4.28 06/22/2023 1103   HGB 12.5 06/22/2023 1103   HGB 13.2 05/05/2023 0950   HCT 38.1 06/22/2023 1103   HCT 40.6 05/05/2023 0950   PLT 300 06/22/2023 1103   PLT 271 05/05/2023 0950   MCV 89.0 06/22/2023 1103   MCV 89 05/05/2023 0950   MCH 29.2 06/22/2023 1103   MCHC 32.8 06/22/2023 1103   RDW 13.8 06/22/2023 1103   RDW 12.4 05/05/2023 0950   LYMPHSABS 4.2 (H) 06/22/2023 1103   MONOABS 0.5 06/22/2023 1103   EOSABS 0.2 06/22/2023 1103   BASOSABS 0.1 06/22/2023 1103    No results found for: "HGBA1C"     Assessment & Plan Hysterectomy status Doing well postsurgery - Monitor for surgery: Menopause symptoms,  manage with hormone replacement therapy as prescribed by GYN - Advise patience with appetite changes, monitor weight. - Follow up with GYN on May 5th.  Hypertension Blood pressure well-controlled on amlodipine and chlorthalidone. - Refill amlodipine and chlorthalidone prescriptions. -Counseled on blood pressure goal of less than 130/80, low-sodium, DASH diet, medication compliance, 150 minutes of moderate intensity exercise per week. Discussed medication compliance, adverse effects.   Hyperlipidemia Off cholesterol medication for 1-2 months due to pharmacy issues. Previous levels high. - Provide 90-day supply of cholesterol medication. - Check cholesterol levels at next visit in three months.  Tobacco Use Disorder Ready to quit smoking, using nicotine patches and gum. Declined pneumonia vaccine. -Spent 3 minutes counseling on smoking cessation and she is ready to quit - Encourage smoking cessation using nicotine patches and gum. - Discuss benefits of pneumonia vaccination due to smoking history.      Meds ordered this encounter  Medications   amLODipine (NORVASC) 10 MG tablet    Sig: Take 1 tablet (10 mg total) by mouth daily.    Dispense:  90 tablet    Refill:  1    Dose increase   atorvastatin (LIPITOR) 20 MG tablet    Sig: Take 1 tablet (20 mg total) by mouth daily.    Dispense:  90 tablet    Refill:  1  chlorthalidone (HYGROTON) 25 MG tablet    Sig: Take 1 tablet (25 mg total) by mouth daily.    Dispense:  90 tablet    Refill:  1    Follow-up: Return in about 3 months (around 10/26/2023) for Chronic medical conditions and fasting labs.       Joaquin Mulberry, MD, FAAFP. [facility] [place], [place] [phone]   07/27/2023, 10:15 AM

## 2023-07-28 ENCOUNTER — Telehealth: Admitting: Physician Assistant

## 2023-07-28 NOTE — Progress Notes (Signed)
 After discussion with patient, appt was rescheduled for 4/29 as she has not been able to start her medication due to waiting on her paycheck. She is going to pick up today and start.   No charge.

## 2023-07-29 ENCOUNTER — Other Ambulatory Visit: Payer: Self-pay | Admitting: Family Medicine

## 2023-07-29 DIAGNOSIS — N6320 Unspecified lump in the left breast, unspecified quadrant: Secondary | ICD-10-CM

## 2023-08-03 ENCOUNTER — Other Ambulatory Visit: Payer: Medicaid Other

## 2023-08-03 ENCOUNTER — Encounter: Payer: Self-pay | Admitting: Family Medicine

## 2023-08-03 ENCOUNTER — Ambulatory Visit
Admission: RE | Admit: 2023-08-03 | Discharge: 2023-08-03 | Disposition: A | Discharge status: Home / Self Care | Source: Ambulatory Visit | Attending: Family Medicine | Admitting: Family Medicine

## 2023-08-03 ENCOUNTER — Other Ambulatory Visit: Payer: Self-pay | Admitting: Family Medicine

## 2023-08-03 DIAGNOSIS — N6310 Unspecified lump in the right breast, unspecified quadrant: Secondary | ICD-10-CM

## 2023-08-03 DIAGNOSIS — N6315 Unspecified lump in the right breast, overlapping quadrants: Secondary | ICD-10-CM | POA: Diagnosis not present

## 2023-08-03 DIAGNOSIS — N6321 Unspecified lump in the left breast, upper outer quadrant: Secondary | ICD-10-CM | POA: Diagnosis not present

## 2023-08-03 DIAGNOSIS — N6320 Unspecified lump in the left breast, unspecified quadrant: Secondary | ICD-10-CM

## 2023-08-09 ENCOUNTER — Telehealth: Admitting: Physician Assistant

## 2023-08-09 ENCOUNTER — Encounter: Payer: Self-pay | Admitting: Physician Assistant

## 2023-08-09 NOTE — Progress Notes (Signed)
 The patient no-showed for appointment despite this provider sending direct link, reaching out via phone with no response and waiting for at least 10 minutes from appointment time for patient to join. They will be marked as a NS for this appointment/time.  ? ?[name], PA-C ? ? ? ?

## 2023-08-15 ENCOUNTER — Inpatient Hospital Stay: Attending: Psychiatry | Admitting: Psychiatry

## 2023-08-15 ENCOUNTER — Encounter: Payer: Self-pay | Admitting: Psychiatry

## 2023-08-15 VITALS — BP 132/66 | HR 98 | Temp 98.3°F | Resp 16 | Ht 62.0 in | Wt 166.0 lb

## 2023-08-15 DIAGNOSIS — D271 Benign neoplasm of left ovary: Secondary | ICD-10-CM

## 2023-08-15 DIAGNOSIS — N9489 Other specified conditions associated with female genital organs and menstrual cycle: Secondary | ICD-10-CM

## 2023-08-15 DIAGNOSIS — Z90722 Acquired absence of ovaries, bilateral: Secondary | ICD-10-CM

## 2023-08-15 DIAGNOSIS — Z9079 Acquired absence of other genital organ(s): Secondary | ICD-10-CM

## 2023-08-15 DIAGNOSIS — R232 Flushing: Secondary | ICD-10-CM

## 2023-08-15 DIAGNOSIS — N761 Subacute and chronic vaginitis: Secondary | ICD-10-CM

## 2023-08-15 DIAGNOSIS — Z90710 Acquired absence of both cervix and uterus: Secondary | ICD-10-CM

## 2023-08-15 DIAGNOSIS — E8941 Symptomatic postprocedural ovarian failure: Secondary | ICD-10-CM

## 2023-08-15 MED ORDER — ESTRADIOL 0.1 MG/24HR TD PTTW: 1.0000 | MEDICATED_PATCH | TRANSDERMAL | 12 refills | Status: AC

## 2023-08-15 MED ORDER — FLUCONAZOLE 150 MG PO TABS: 150.0000 mg | ORAL_TABLET | Freq: Every day | ORAL | 0 refills | Status: AC

## 2023-08-15 NOTE — Patient Instructions (Signed)
 It was a pleasure to see you in clinic today. - Sent a new prescription for a different estrogen patch. This is twice a week. See if this works better for you. - Also sent one pill of diflucan to help with possible yeast infection - Lifting restrictions end tomorrow. - Okay to resume routine care.   Thank you very much for allowing me to provide care for you today.  I appreciate your confidence in choosing our Gynecologic Oncology team at [facility].  If you have any questions about your visit today please call our office or send us  a MyChart message and we will get back to you as soon as possible.

## 2023-08-15 NOTE — Progress Notes (Signed)
 Gynecologic Oncology Return Clinic Visit  Date of Service: 08/15/2023 Referring Provider: Kiki Pelton, MD   Assessment & Plan: Tammy Vargas is a 43 y.o. woman with bilateral complex adnexal masses, elevated tumor marker, uterine fibroids, AUB, chronic pelvic pain who is s/p RA-TLH, BSO, blt ureterolysis on 07/05/23, benign final pathology. Presents today for postop follow-up.   Postop: - Pt recovering well from surgery and healing appropriately postoperatively - Lifting restrictions are lifted. Nothing in the vagina for 4 more weeks.  - Pt works in Pharmac[employer], Human resources officer. Okay to return to work at 6 weeks.  Postmenopausal symptoms - Climara  helps but not completely - Will trial vivelle  dot instaed  Vaginal itching - No discharge - No improvement since last visit - Rx for 1 dose of diflucan.   RTC PRN  Derrel Flies, MD Gynecologic Oncology    ----------------------- Reason for Visit: Postop, follow-up  Interval History: Patient reports that she is overall doing well.  Pain has resolved.  Appetite back to normal.  Voiding without issue and having regular bowel movements.  No vaginal bleeding or discharge.  Still having vaginal itching.  Using Climara  patch but still having hot flashes.  They are helped some by the patch but not completely.    Past Medical/Surgical History: Past Medical History:  Diagnosis Date   Hyperlipidemia    Hypertension 01/04/2022    Past Surgical History:  Procedure Laterality Date   HYSTERECTOMY,TOTAL,BILAT SALPINGO-OOPHORECTOMY, ROBOT, LAP Bilateral 07/05/2023   Procedure: ROBOTIC ASSISTED TOTAL LAPAROSCOPIC HYSTERECTOMY, BILATERAL SAPLINGO-OOPHORECTOMY, BILATERAL URETEROLYSIS;  Surgeon: Derrel Flies, MD;  Location: [facility];  Service: Gynecology;  Laterality: Bilateral;    Family History  Problem Relation Age of Onset   Hypertension Mother    Hypertension Father    Breast cancer Maternal Aunt    Pancreatic cancer Maternal  Uncle    Colon cancer Paternal Uncle    Ovarian cancer Neg Hx    Endometrial cancer Neg Hx    Prostate cancer Neg Hx     Social History   Socioeconomic History   Marital status: Single    Spouse name: Not on file   Number of children: Not on file   Years of education: Not on file   Highest education level: Not on file  Occupational History   Not on file  Tobacco Use   Smoking status: Every Day    Current packs/day: 3.00    Average packs/day: 2.0 packs/day for 24.3 years (48.3 ttl pk-yrs)    Types: Cigarettes    Start date: 2001   Smokeless tobacco: Never  Vaping Use   Vaping status: Never Used  Substance and Sexual Activity   Alcohol use: Yes    Comment: occ   Drug use: Not Currently    Types: Marijuana    Comment: on occas   Sexual activity: Not Currently    Partners: Male    Birth control/protection: None  Other Topics Concern   Not on file  Social History Narrative   Not on file   Social Drivers of Health   Financial Resource Strain: Not on file  Food Insecurity: Food Insecurity Present (05/30/2023)   Hunger Vital Sign    Worried About Running Out of Food in the Last Year: Sometimes true    Ran Out of Food in the Last Year: Often true  Transportation Needs: No Transportation Needs (05/05/2023)   PRAPARE - Administrator, Civil Service (Medical): No    Lack of Transportation (Non-Medical): No  Physical  Activity: Not on file  Stress: Not on file  Social Connections: Not on file    Current Medications:  Current Outpatient Medications:    amLODipine  (NORVASC ) 10 MG tablet, Take 1 tablet (10 mg total) by mouth daily., Disp: 90 tablet, Rfl: 1   atorvastatin  (LIPITOR) 20 MG tablet, Take 1 tablet (20 mg total) by mouth daily., Disp: 90 tablet, Rfl: 1   chlorthalidone  (HYGROTON ) 25 MG tablet, Take 1 tablet (25 mg total) by mouth daily., Disp: 90 tablet, Rfl: 1   estradiol  (VIVELLE -DOT) 0.1 MG/24HR patch, Place 1 patch (0.1 mg total) onto the skin 2  (two) times a week., Disp: 8 patch, Rfl: 12   fluconazole (DIFLUCAN) 150 MG tablet, Take 1 tablet (150 mg total) by mouth daily for 1 day., Disp: 1 tablet, Rfl: 0   nicotine  (NICODERM CQ  - DOSED IN MG/24 HOURS) 21 mg/24hr patch, Place 1 patch (21 mg total) onto the skin daily., Disp: 30 patch, Rfl: 0   nicotine  polacrilex (NICORETTE ) 4 MG gum, Weeks 1 to 6: Chew 1 piece of gum every 1 to 2 hours (maximum: 24 pieces/day); to increase chances of quitting, chew at least 9 pieces/day during the first 6 weeks., Disp: 100 each, Rfl: 0   traMADol  (ULTRAM ) 50 MG tablet, Take 1 tablet (50 mg total) by mouth every 6 (six) hours as needed for severe pain (pain score 7-10). For AFTER surgery only, do not take and drive, Disp: 15 tablet, Rfl: 0  Review of Symptoms: Complete 10-system review is positive for: Back pain, hot flashes  Physical Exam: BP 132/66 (BP Location: Left Arm, Patient Position: Sitting)   Pulse 98   Temp 98.3 F (36.8 C) (Tympanic)   Resp 16   Ht 5\' 2"  (1.575 m)   Wt 166 lb (75.3 kg)   SpO2 100%   BMI 30.36 kg/m  General: Alert, oriented, no acute distress. HEENT: Normocephalic, atraumatic. Neck symmetric without masses. Sclera anicteric.  Chest: Normal work of breathing. Cardiovascular: Regular rate Abdomen: Soft, nontender.  Well-healing incisions. Extremities: Grossly normal range of motion.  Warm, well perfused.  No edema bilaterally.   Laboratory & Radiologic Studies: None

## 2023-08-22 DIAGNOSIS — Z419 Encounter for procedure for purposes other than remedying health state, unspecified: Secondary | ICD-10-CM | POA: Diagnosis not present

## 2023-09-22 DIAGNOSIS — Z419 Encounter for procedure for purposes other than remedying health state, unspecified: Secondary | ICD-10-CM | POA: Diagnosis not present

## 2023-10-22 DIAGNOSIS — Z419 Encounter for procedure for purposes other than remedying health state, unspecified: Secondary | ICD-10-CM | POA: Diagnosis not present

## 2023-10-27 ENCOUNTER — Ambulatory Visit: Admitting: Family Medicine

## 2023-11-08 ENCOUNTER — Telehealth: Payer: Self-pay | Admitting: Family Medicine

## 2023-11-08 NOTE — Telephone Encounter (Addendum)
 Called patient, no answer. Left voicemail confirming upcoming appointment on 11/09/2023 at 10:10 am. Provided callback number for any questions or changes.

## 2023-11-09 ENCOUNTER — Ambulatory Visit: Attending: Family Medicine | Admitting: Family Medicine

## 2023-11-22 DIAGNOSIS — Z419 Encounter for procedure for purposes other than remedying health state, unspecified: Secondary | ICD-10-CM | POA: Diagnosis not present

## 2023-12-23 DIAGNOSIS — Z419 Encounter for procedure for purposes other than remedying health state, unspecified: Secondary | ICD-10-CM | POA: Diagnosis not present

## 2024-02-03 ENCOUNTER — Ambulatory Visit
Admission: RE | Admit: 2024-02-03 | Discharge: 2024-02-03 | Disposition: A | Discharge status: Home / Self Care | Source: Ambulatory Visit | Attending: Family Medicine | Admitting: Family Medicine

## 2024-02-03 ENCOUNTER — Ambulatory Visit: Payer: Self-pay | Admitting: Family Medicine

## 2024-02-03 DIAGNOSIS — N6321 Unspecified lump in the left breast, upper outer quadrant: Secondary | ICD-10-CM | POA: Diagnosis not present

## 2024-02-03 DIAGNOSIS — N6310 Unspecified lump in the right breast, unspecified quadrant: Secondary | ICD-10-CM

## 2024-02-03 DIAGNOSIS — N6320 Unspecified lump in the left breast, unspecified quadrant: Secondary | ICD-10-CM

## 2024-02-03 DIAGNOSIS — N6315 Unspecified lump in the right breast, overlapping quadrants: Secondary | ICD-10-CM | POA: Diagnosis not present

## 2024-02-03 DIAGNOSIS — R928 Other abnormal and inconclusive findings on diagnostic imaging of breast: Secondary | ICD-10-CM | POA: Diagnosis not present

## 2024-02-13 ENCOUNTER — Telehealth: Payer: Self-pay | Admitting: Family Medicine

## 2024-02-13 NOTE — Telephone Encounter (Signed)
 Copied from CRM [id number]. Topic: General - Other >> Feb 13, 2024  9:56 AM Tobias CROME wrote: Reason for CRM: Patient inquiring if paperwork was completed. Patient states she is needing this to be completed to start her job. Needing paperwork as soon as possible.   Best callback number: [phone]

## 2024-02-13 NOTE — Telephone Encounter (Signed)
 Patient has been called and informed that paperwork is ready for pick up.  Form placed at front desk for pick up.

## 2024-02-14 ENCOUNTER — Encounter: Payer: Self-pay | Admitting: Family Medicine

## 2024-02-14 ENCOUNTER — Ambulatory Visit: Admitting: Family Medicine

## 2024-02-14 NOTE — Telephone Encounter (Signed)
 Copied from CRM [id number]. Topic: General - Other >> Feb 14, 2024  1:03 PM Amber H wrote:  Reason for CRM: Patient stated that her job was requesting that her paperwork from yesterday was stamped with the doctors signature in order for her to start the job. And wanted to know  did she need to do an actual physical in order for her to sign off on paperwork.   [id number]

## 2024-02-15 NOTE — Telephone Encounter (Signed)
 Patient was called and she states that the paperwork needs an office stamps.I informed the patient that she can bring the form in the office for a stamp.

## 2024-03-23 DIAGNOSIS — Z419 Encounter for procedure for purposes other than remedying health state, unspecified: Secondary | ICD-10-CM | POA: Diagnosis not present

## 2024-03-27 ENCOUNTER — Encounter: Payer: Self-pay | Admitting: Family Medicine

## 2024-03-27 ENCOUNTER — Ambulatory Visit: Attending: Family Medicine | Admitting: Family Medicine

## 2024-03-27 VITALS — BP 151/92 | HR 80 | Temp 98.8°F | Ht 62.0 in | Wt 166.4 lb

## 2024-03-27 DIAGNOSIS — Z131 Encounter for screening for diabetes mellitus: Secondary | ICD-10-CM

## 2024-03-27 DIAGNOSIS — F17213 Nicotine dependence, cigarettes, with withdrawal: Secondary | ICD-10-CM | POA: Diagnosis not present

## 2024-03-27 DIAGNOSIS — I10 Essential (primary) hypertension: Secondary | ICD-10-CM

## 2024-03-27 DIAGNOSIS — R5383 Other fatigue: Secondary | ICD-10-CM

## 2024-03-27 DIAGNOSIS — M5412 Radiculopathy, cervical region: Secondary | ICD-10-CM

## 2024-03-27 DIAGNOSIS — E7800 Pure hypercholesterolemia, unspecified: Secondary | ICD-10-CM

## 2024-03-27 DIAGNOSIS — Z1159 Encounter for screening for other viral diseases: Secondary | ICD-10-CM

## 2024-03-27 MED ORDER — AMLODIPINE BESYLATE 10 MG PO TABS: 10.0000 mg | ORAL_TABLET | Freq: Every day | ORAL | 1 refills | Status: AC

## 2024-03-27 MED ORDER — GABAPENTIN 300 MG PO CAPS: 300.0000 mg | ORAL_CAPSULE | Freq: Every day | ORAL | 3 refills | Status: AC

## 2024-03-27 MED ORDER — BUPROPION HCL ER (SR) 150 MG PO TB12: ORAL_TABLET | ORAL | 0 refills | Status: AC

## 2024-03-27 MED ORDER — NICOTINE POLACRILEX 4 MG MT GUM: CHEWING_GUM | OROMUCOSAL | 2 refills | Status: AC

## 2024-03-27 MED ORDER — CHLORTHALIDONE 25 MG PO TABS: 25.0000 mg | ORAL_TABLET | Freq: Every day | ORAL | 1 refills | Status: AC

## 2024-03-27 NOTE — Patient Instructions (Signed)
 VISIT SUMMARY:  Today, you were seen for numbness in your left arm that has been persistent for the past one to two months. We also discussed your hypertension, hyperlipidemia, smoking habits, and fatigue. Additionally, we reviewed some general health maintenance items.  YOUR PLAN:  -CERVICAL RADICULOPATHY: Cervical radiculopathy is a condition where a nerve in the neck is compressed or irritated, causing pain and numbness that can radiate down the arm. You have been referred to orthopedics for further evaluation and possible nerve conduction studies. You have also been prescribed gabapentin  to take at night to help manage the symptoms.  -HYPERTENSION: Hypertension, or high blood pressure, can increase the risk of heart disease and stroke. Your blood pressure was elevated today, possibly due to stress. Continue taking your current medications, amlodipine  and chlorthalidone , as prescribed. Avoid smoking and caffeine before your next visit. We will reassess your blood pressure in six weeks.  -HYPERLIPIDEMIA: Hyperlipidemia is a condition where there are high levels of fats (lipids) in the blood, which can increase the risk of heart disease. You have not been taking your atorvastatin  consistently. A cholesterol test has been ordered, and your atorvastatin  prescription will be refilled based on the test results.  -TOBACCO USE DISORDER: Tobacco use disorder is a dependence on tobacco products. You are currently smoking 1-2 cigarettes daily and want to quit. You have been prescribed nicotine  gum and bupropion  to help you stop smoking.  -FATIGUE: Fatigue is a feeling of tiredness or low energy. Despite getting adequate sleep, you still feel tired, which may be related to stress. Thyroid function tests and a vitamin D  level test have been ordered to further investigate the cause of your fatigue.  -GENERAL HEALTH MAINTENANCE: General health maintenance includes routine screenings and tests to ensure overall  health. Screening for hepatitis C and hepatitis B immunity has been recommended, and tests have been ordered.  INSTRUCTIONS:  Please follow up with orthopedics for your cervical radiculopathy evaluation. Continue taking your current medications for hypertension and avoid smoking and caffeine before your next visit. We will reassess your blood pressure in six weeks. Take the prescribed nicotine  gum and bupropion  to help with smoking cessation. Get the ordered cholesterol test, thyroid function tests, vitamin D  level test, and screenings for hepatitis C and hepatitis B immunity. Follow up with us  once the test results are available.

## 2024-03-27 NOTE — Progress Notes (Signed)
 Subjective:  Patient ID: Tammy Vargas, female    DOB: Dec 12, 1980  Age: 43 y.o. MRN: [id number]  CC: Medical Management of Chronic Issues (Numbness in left arm down to the fingers  )     Discussed the use of AI scribe software for clinical note transcription with the patient, who gave verbal consent to proceed.  History of Present Illness Tammy Vargas is a 43 year old female who presents with numbness in her left arm.  She has had persistent numbness in her left arm for the past one to two months, from the shoulder to the hand, including the axilla. She rates it 8/10 in how bothersome it is. It does not change with neck movement, arm position, or stretching. She is right-handed and has no neck pain. She works in housekeeping at a hospital.  She feels tired with low energy despite now getting about eight hours of sleep nightly after quitting a second job two weeks ago. Her energy has not improved.  She has hypertension treated with amlodipine  and chlorthalidone , which she takes consistently. She has hyperlipidemia and is prescribed atorvastatin  but has not taken it consistently.  She smokes one to two cigarettes per day and wants to quit. She is open to restarting nicotine  patches and using nicotine  gum and medication to support smoking cessation.    Past Medical History:  Diagnosis Date   Hyperlipidemia    Hypertension 01/04/2022    Past Surgical History:  Procedure Laterality Date   HYSTERECTOMY,TOTAL,BILAT SALPINGO-OOPHORECTOMY, ROBOT, LAP Bilateral 07/05/2023   Procedure: ROBOTIC ASSISTED TOTAL LAPAROSCOPIC HYSTERECTOMY, BILATERAL SAPLINGO-OOPHORECTOMY, BILATERAL URETEROLYSIS;  Surgeon: Eldonna Mays, MD;  Location: [facility];  Service: Gynecology;  Laterality: Bilateral;    Family History  Problem Relation Age of Onset   Hypertension Mother    Hypertension Father    Breast cancer Maternal Aunt    Pancreatic cancer Maternal Uncle    Colon cancer Paternal Uncle    Ovarian  cancer Neg Hx    Endometrial cancer Neg Hx    Prostate cancer Neg Hx     Social History   Socioeconomic History   Marital status: Single    Spouse name: Not on file   Number of children: Not on file   Years of education: Not on file   Highest education level: Not on file  Occupational History   Not on file  Tobacco Use   Smoking status: Every Day    Current packs/day: 3.00    Average packs/day: 2.0 packs/day for 25.0 years (50.1 ttl pk-yrs)    Types: Cigarettes    Start date: 2001   Smokeless tobacco: Never  Vaping Use   Vaping status: Never Used  Substance and Sexual Activity   Alcohol use: Yes    Comment: occ   Drug use: Not Currently    Types: Marijuana    Comment: on occas   Sexual activity: Not Currently    Partners: Male    Birth control/protection: None  Other Topics Concern   Not on file  Social History Narrative   Not on file   Social Drivers of Health   Tobacco Use: High Risk (03/27/2024)   Patient History    Smoking Tobacco Use: Every Day    Smokeless Tobacco Use: Never    Passive Exposure: Not on file  Financial Resource Strain: Not on file  Food Insecurity: Food Insecurity Present (05/30/2023)   Hunger Vital Sign    Worried About Running Out of Food in the Last Year: Sometimes  true    Ran Out of Food in the Last Year: Often true  Transportation Needs: No Transportation Needs (05/05/2023)   PRAPARE - Administrator, Civil Service (Medical): No    Lack of Transportation (Non-Medical): No  Physical Activity: Not on file  Stress: Not on file  Social Connections: Not on file  Depression (PHQ2-9): Low Risk (03/27/2024)   Depression (PHQ2-9)    PHQ-2 Score: 1  Alcohol Screen: Low Risk (07/21/2023)   Alcohol Screen    Last Alcohol Screening Score (AUDIT): 6  Housing: High Risk (05/25/2023)   Housing Stability Vital Sign    Unable to Pay for Housing in the Last Year: Yes    Number of Times Moved in the Last Year: Not on file    Homeless in  the Last Year: No  Utilities: At Risk (05/25/2023)   [facility] Utilities    Threatened with loss of utilities: Yes  Health Literacy: Not on file    Allergies[1]  Outpatient Medications Prior to Visit  Medication Sig Dispense Refill   atorvastatin  (LIPITOR) 20 MG tablet Take 1 tablet (20 mg total) by mouth daily. 90 tablet 1   estradiol  (VIVELLE -DOT) 0.1 MG/24HR patch Place 1 patch (0.1 mg total) onto the skin 2 (two) times a week. 8 patch 12   nicotine  (NICODERM CQ  - DOSED IN MG/24 HOURS) 21 mg/24hr patch Place 1 patch (21 mg total) onto the skin daily. 30 patch 0   traMADol  (ULTRAM ) 50 MG tablet Take 1 tablet (50 mg total) by mouth every 6 (six) hours as needed for severe pain (pain score 7-10). For AFTER surgery only, do not take and drive 15 tablet 0   amLODipine  (NORVASC ) 10 MG tablet Take 1 tablet (10 mg total) by mouth daily. 90 tablet 1   chlorthalidone  (HYGROTON ) 25 MG tablet Take 1 tablet (25 mg total) by mouth daily. 90 tablet 1   nicotine  polacrilex (NICORETTE ) 4 MG gum Weeks 1 to 6: Chew 1 piece of gum every 1 to 2 hours (maximum: 24 pieces/day); to increase chances of quitting, chew at least 9 pieces/day during the first 6 weeks. 100 each 0   No facility-administered medications prior to visit.     ROS Review of Systems  Constitutional:  Positive for fatigue. Negative for activity change and appetite change.  HENT:  Negative for sinus pressure and sore throat.   Respiratory:  Negative for chest tightness, shortness of breath and wheezing.   Cardiovascular:  Negative for chest pain and palpitations.  Gastrointestinal:  Negative for abdominal distention, abdominal pain and constipation.  Genitourinary: Negative.   Musculoskeletal: Negative.   Neurological:  Positive for numbness.  Psychiatric/Behavioral:  Negative for behavioral problems and dysphoric mood.     Objective:  BP (!) 151/92   Pulse 80   Temp 98.8 F (37.1 C) (Oral)   Ht 5' 2 (1.575 m)   Wt 166 lb 6.4 oz  (75.5 kg)   SpO2 98%   BMI 30.43 kg/m      03/27/2024    9:32 AM 03/27/2024    8:46 AM 08/15/2023    2:57 PM  BP/Weight  Systolic BP 151 148 132  Diastolic BP 92 95 66  Wt. (Lbs)  166.4 166  BMI  30.43 kg/m2 30.36 kg/m2      Physical Exam Constitutional:      Appearance: She is well-developed.  Cardiovascular:     Rate and Rhythm: Normal rate.     Heart sounds: Normal heart  sounds. No murmur heard. Pulmonary:     Effort: Pulmonary effort is normal.     Breath sounds: Normal breath sounds. No wheezing or rales.  Chest:     Chest wall: No tenderness.  Abdominal:     General: Bowel sounds are normal. There is no distension.     Palpations: Abdomen is soft. There is no mass.     Tenderness: There is no abdominal tenderness.  Musculoskeletal:        General: Normal range of motion.     Cervical back: Normal range of motion. No tenderness.     Right lower leg: No edema.     Left lower leg: No edema.     Comments: Negative Tinel and Phalen signs bilaterally  Neurological:     Mental Status: She is alert and oriented to person, place, and time.  Psychiatric:        Mood and Affect: Mood normal.        Latest Ref Rng & Units 06/22/2023   11:03 AM 06/22/2023    7:41 AM 05/05/2023    9:50 AM  CMP  Glucose 70 - 99 mg/dL 82  97  95   BUN 6 - 20 mg/dL 8  11  7    Creatinine 0.44 - 1.00 mg/dL 9.18  9.18  9.17   Sodium 135 - 145 mmol/L 137  137  137   Potassium 3.5 - 5.1 mmol/L 4.0  4.1  5.1   Chloride 98 - 111 mmol/L 104  106  102   CO2 22 - 32 mmol/L 24  23  19    Calcium  8.9 - 10.3 mg/dL 8.8  8.7  8.8   Total Protein 6.5 - 8.1 g/dL  7.5    Total Bilirubin 0.0 - 1.2 mg/dL  0.3    Alkaline Phos 38 - 126 U/L  85    AST 15 - 41 U/L  16    ALT 0 - 44 U/L  13      Lipid Panel     Component Value Date/Time   CHOL 231 (H) 12/22/2022 1135   TRIG 64 12/22/2022 1135   HDL 64 12/22/2022 1135   LDLCALC 156 (H) 12/22/2022 1135    CBC    Component Value Date/Time   WBC  8.0 06/22/2023 1103   RBC 4.28 06/22/2023 1103   HGB 12.5 06/22/2023 1103   HGB 13.2 05/05/2023 0950   HCT 38.1 06/22/2023 1103   HCT 40.6 05/05/2023 0950   PLT 300 06/22/2023 1103   PLT 271 05/05/2023 0950   MCV 89.0 06/22/2023 1103   MCV 89 05/05/2023 0950   MCH 29.2 06/22/2023 1103   MCHC 32.8 06/22/2023 1103   RDW 13.8 06/22/2023 1103   RDW 12.4 05/05/2023 0950   LYMPHSABS 4.2 (H) 06/22/2023 1103   MONOABS 0.5 06/22/2023 1103   EOSABS 0.2 06/22/2023 1103   BASOSABS 0.1 06/22/2023 1103    No results found for: HGBA1C      Assessment & Plan Cervical radiculopathy Numbness in left arm, differential includes carpal tunnel syndrome and cervical radiculopathy. No neck pain, symptoms unaffected by arm or neck movement. - Referred to orthopedics for evaluation and possible nerve conduction studies. - Prescribed gabapentin  for nighttime use.  Hypertension Blood pressure elevated, possibly due to stress per patient as she states there has been some family drama of a family member who is actively dying. Current medications: amlodipine , chlorthalidone . - Continue current antihypertensive medications. - Advised to avoid smoking and caffeine  before next visit. - Scheduled follow-up in six weeks to reassess blood pressure.  Hyperlipidemia Previous high cholesterol, inconsistent atorvastatin  use. - Ordered cholesterol test. - Refill atorvastatin  prescription based on test results.  Tobacco use disorder Smoking 1-2 cigarettes daily, desires cessation. - Prescribed nicotine  gum and bupropion . -Smoking cessation support: smoking cessation hotline: 1-800-QUIT-NOW.  Smoking cessation classes are available through [facility]. Call [phone] or visit our website at [email or web address].[email or web address].  Spent 3 minutes counseling on dangers of tobacco use and benefits of quitting, offered pharmacological intervention to aid quitting and patient is ready to  quit.   Fatigue Possibly stress-related as she was working 2 jobs until the last 2 weeks, adequate sleep reported. - Ordered thyroid function tests and vitamin D  level.  General health maintenance Screening for hepatitis C and hepatitis B immunity recommended. Screening for viral disease- Ordered hepatitis C screening and hepatitis B immunity test.     Healthcare maintenance Up-to-date on screening mammogram  Meds ordered this encounter  Medications   amLODipine  (NORVASC ) 10 MG tablet    Sig: Take 1 tablet (10 mg total) by mouth daily.    Dispense:  90 tablet    Refill:  1   chlorthalidone  (HYGROTON ) 25 MG tablet    Sig: Take 1 tablet (25 mg total) by mouth daily.    Dispense:  90 tablet    Refill:  1   nicotine  polacrilex (NICORETTE ) 4 MG gum    Sig: Weeks 1 to 6: Chew 1 piece of gum every 1 to 2 hours (maximum: 24 pieces/day); to increase chances of quitting, chew at least 9 pieces/day during the first 6 weeks.    Dispense:  100 each    Refill:  2   buPROPion  (WELLBUTRIN  SR) 150 MG 12 hr tablet    Sig: 150mg  orally daily for 3 days then increase to twice daily.    Dispense:  180 tablet    Refill:  0   gabapentin  (NEURONTIN ) 300 MG capsule    Sig: Take 1 capsule (300 mg total) by mouth at bedtime.    Dispense:  30 capsule    Refill:  3    Follow-up: Return in about 1 month (around 04/27/2024) for Blood Pressure follow-up with PCP.       Corrina Sabin, MD, FAAFP. [facility] [place], [place] [phone]   03/27/2024, 12:49 PM    [1] No Known Allergies

## 2024-03-28 LAB — CBC WITH DIFFERENTIAL/PLATELET
Basophils Absolute: 0 x10E3/uL (ref 0.0–0.2)
Basos: 1 %
EOS (ABSOLUTE): 0.1 x10E3/uL (ref 0.0–0.4)
Eos: 1 %
Hematocrit: 42.1 % (ref 34.0–46.6)
Hemoglobin: 13.7 g/dL (ref 11.1–15.9)
Immature Grans (Abs): 0 x10E3/uL (ref 0.0–0.1)
Immature Granulocytes: 0 %
Lymphocytes Absolute: 3.5 x10E3/uL — ABNORMAL HIGH (ref 0.7–3.1)
Lymphs: 44 %
MCH: 29.5 pg (ref 26.6–33.0)
MCHC: 32.5 g/dL (ref 31.5–35.7)
MCV: 91 fL (ref 79–97)
Monocytes Absolute: 0.5 x10E3/uL (ref 0.1–0.9)
Monocytes: 6 %
Neutrophils Absolute: 3.8 x10E3/uL (ref 1.4–7.0)
Neutrophils: 48 %
Platelets: 325 x10E3/uL (ref 150–450)
RBC: 4.64 x10E6/uL (ref 3.77–5.28)
RDW: 13 % (ref 11.7–15.4)
WBC: 7.9 x10E3/uL (ref 3.4–10.8)

## 2024-03-28 LAB — CMP14+EGFR
ALT: 16 IU/L (ref 0–32)
AST: 14 IU/L (ref 0–40)
Albumin: 4 g/dL (ref 3.9–4.9)
Alkaline Phosphatase: 117 IU/L — ABNORMAL HIGH (ref 41–116)
BUN/Creatinine Ratio: 10 (ref 9–23)
BUN: 9 mg/dL (ref 6–24)
Bilirubin Total: 0.2 mg/dL (ref 0.0–1.2)
CO2: 23 mmol/L (ref 20–29)
Calcium: 9.4 mg/dL (ref 8.7–10.2)
Chloride: 104 mmol/L (ref 96–106)
Creatinine, Ser: 0.88 mg/dL (ref 0.57–1.00)
Globulin, Total: 2.7 g/dL (ref 1.5–4.5)
Glucose: 102 mg/dL — ABNORMAL HIGH (ref 70–99)
Potassium: 4.6 mmol/L (ref 3.5–5.2)
Sodium: 140 mmol/L (ref 134–144)
Total Protein: 6.7 g/dL (ref 6.0–8.5)
eGFR: 84 mL/min/1.73 (ref >59)

## 2024-03-28 LAB — LP+NON-HDL CHOLESTEROL
Cholesterol, Total: 242 mg/dL — ABNORMAL HIGH (ref 100–199)
HDL: 69 mg/dL (ref >39)
LDL Chol Calc (NIH): 159 mg/dL — ABNORMAL HIGH (ref 0–99)
Total Non-HDL-Chol (LDL+VLDL): 173 mg/dL — ABNORMAL HIGH (ref 0–129)
Triglycerides: 80 mg/dL (ref 0–149)
VLDL Cholesterol Cal: 14 mg/dL (ref 5–40)

## 2024-03-28 LAB — TSH: TSH: 0.765 u[IU]/mL (ref 0.450–4.500)

## 2024-03-28 LAB — VITAMIN D 25 HYDROXY (VIT D DEFICIENCY, FRACTURES): Vit D, 25-Hydroxy: 7.8 ng/mL — ABNORMAL LOW (ref 30.0–100.0)

## 2024-03-28 LAB — T4, FREE: Free T4: 0.89 ng/dL (ref 0.82–1.77)

## 2024-03-28 LAB — HEMOGLOBIN A1C
Est. average glucose Bld gHb Est-mCnc: 120 mg/dL
Hgb A1c MFr Bld: 5.8 % — ABNORMAL HIGH (ref 4.8–5.6)

## 2024-03-28 LAB — HCV INTERPRETATION

## 2024-03-28 LAB — HCV AB W REFLEX TO QUANT PCR: HCV Ab: NONREACTIVE

## 2024-03-28 LAB — HEPATITIS B SURFACE ANTIBODY, QUANTITATIVE: Hepatitis B Surf Ab Quant: <3.5 m[IU]/mL — ABNORMAL LOW

## 2024-03-29 ENCOUNTER — Ambulatory Visit: Payer: Self-pay | Admitting: Family Medicine

## 2024-03-29 DIAGNOSIS — E7800 Pure hypercholesterolemia, unspecified: Secondary | ICD-10-CM

## 2024-03-29 MED ORDER — ERGOCALCIFEROL 1.25 MG (50000 UT) PO CAPS: 50000.0000 [IU] | ORAL_CAPSULE | ORAL | 1 refills | Status: AC

## 2024-03-29 MED ORDER — ATORVASTATIN CALCIUM 40 MG PO TABS: 20.0000 mg | ORAL_TABLET | Freq: Every day | ORAL | 1 refills | Status: AC

## 2024-04-13 ENCOUNTER — Ambulatory Visit: Admitting: Physician Assistant

## 2024-04-30 ENCOUNTER — Telehealth: Payer: Self-pay | Admitting: Family Medicine

## 2024-04-30 NOTE — Telephone Encounter (Signed)
 Contacted pt mailbox not setup to leave a message to confirmed appt

## 2024-05-01 ENCOUNTER — Ambulatory Visit: Payer: Self-pay | Admitting: Family Medicine

## 2024-06-21 ENCOUNTER — Ambulatory Visit: Admitting: Family Medicine
